# Patient Record
Sex: Female | Born: 1970 | Race: White | Hispanic: No | Marital: Married | Smoking: Never smoker
Health system: Southern US, Community
[De-identification: ages and names within clinical notes are randomized; demographics above are authoritative.]

## PROBLEM LIST (undated history)

## (undated) DIAGNOSIS — R002 Palpitations: Secondary | ICD-10-CM

## (undated) DIAGNOSIS — E282 Polycystic ovarian syndrome: Secondary | ICD-10-CM

## (undated) DIAGNOSIS — Q969 Turner's syndrome, unspecified: Secondary | ICD-10-CM

## (undated) DIAGNOSIS — F411 Generalized anxiety disorder: Secondary | ICD-10-CM

## (undated) DIAGNOSIS — J309 Allergic rhinitis, unspecified: Secondary | ICD-10-CM

## (undated) DIAGNOSIS — I10 Essential (primary) hypertension: Secondary | ICD-10-CM

## (undated) DIAGNOSIS — F329 Major depressive disorder, single episode, unspecified: Secondary | ICD-10-CM

## (undated) DIAGNOSIS — E119 Type 2 diabetes mellitus without complications: Secondary | ICD-10-CM

## (undated) DIAGNOSIS — E785 Hyperlipidemia, unspecified: Secondary | ICD-10-CM

## (undated) DIAGNOSIS — M1990 Unspecified osteoarthritis, unspecified site: Secondary | ICD-10-CM

## (undated) DIAGNOSIS — J019 Acute sinusitis, unspecified: Secondary | ICD-10-CM

## (undated) HISTORY — DX: Turner's syndrome, unspecified: Q96.9

## (undated) HISTORY — DX: Major depressive disorder, single episode, unspecified: F32.9

## (undated) HISTORY — DX: Acute sinusitis, unspecified: J01.90

## (undated) HISTORY — DX: Polycystic ovarian syndrome: E28.2

## (undated) HISTORY — DX: Hyperlipidemia, unspecified: E78.5

## (undated) HISTORY — DX: Type 2 diabetes mellitus without complications: E11.9

## (undated) HISTORY — DX: Unspecified osteoarthritis, unspecified site: M19.90

## (undated) HISTORY — DX: Allergic rhinitis, unspecified: J30.9

## (undated) HISTORY — DX: Essential (primary) hypertension: I10

## (undated) HISTORY — PX: COLONOSCOPY: SHX174

## (undated) HISTORY — DX: Generalized anxiety disorder: F41.1

## (undated) HISTORY — DX: Palpitations: R00.2

---

## 1999-02-19 ENCOUNTER — Other Ambulatory Visit: Admission: RE | Admit: 1999-02-19 | Discharge: 1999-02-19 | Payer: Self-pay | Admitting: Gynecology

## 2003-04-25 ENCOUNTER — Other Ambulatory Visit: Admission: RE | Admit: 2003-04-25 | Discharge: 2003-04-25 | Payer: Self-pay | Admitting: Gynecology

## 2003-05-01 ENCOUNTER — Encounter: Admission: RE | Admit: 2003-05-01 | Discharge: 2003-05-01 | Payer: Self-pay | Admitting: Internal Medicine

## 2003-05-10 ENCOUNTER — Ambulatory Visit (HOSPITAL_COMMUNITY): Admission: RE | Admit: 2003-05-10 | Discharge: 2003-05-10 | Payer: Self-pay | Admitting: Internal Medicine

## 2004-08-11 ENCOUNTER — Other Ambulatory Visit: Admission: RE | Admit: 2004-08-11 | Discharge: 2004-08-11 | Payer: Self-pay | Admitting: Gynecology

## 2004-08-26 ENCOUNTER — Ambulatory Visit: Payer: Self-pay | Admitting: Internal Medicine

## 2005-08-05 ENCOUNTER — Other Ambulatory Visit: Admission: RE | Admit: 2005-08-05 | Discharge: 2005-08-05 | Payer: Self-pay | Admitting: Gynecology

## 2005-08-19 ENCOUNTER — Ambulatory Visit: Payer: Self-pay | Admitting: Internal Medicine

## 2005-09-10 ENCOUNTER — Ambulatory Visit: Payer: Self-pay | Admitting: Internal Medicine

## 2005-09-14 ENCOUNTER — Ambulatory Visit: Payer: Self-pay | Admitting: Internal Medicine

## 2005-12-15 ENCOUNTER — Ambulatory Visit: Payer: Self-pay | Admitting: Internal Medicine

## 2005-12-15 LAB — CONVERTED CEMR LAB
ALT: 27 units/L (ref 0–40)
AST: 27 units/L (ref 0–37)
Albumin: 4.4 g/dL (ref 3.5–5.2)
Basophils Absolute: 0 10*3/uL (ref 0.0–0.1)
Basophils Relative: 0.2 % (ref 0.0–1.0)
CO2: 25 meq/L (ref 19–32)
Chol/HDL Ratio, serum: 4.5
Cholesterol: 174 mg/dL (ref 0–200)
Creatinine, Ser: 0.7 mg/dL (ref 0.4–1.2)
Crystals: NEGATIVE
GFR calc non Af Amer: 101 mL/min
Glomerular Filtration Rate, Af Am: 122 mL/min/{1.73_m2}
Glucose, Bld: 111 mg/dL — ABNORMAL HIGH (ref 70–99)
HCT: 42.1 % (ref 36.0–46.0)
HDL: 38.6 mg/dL — ABNORMAL LOW (ref >39.0)
LDL Cholesterol: 111 mg/dL — ABNORMAL HIGH (ref 0–99)
MCHC: 33.8 g/dL (ref 30.0–36.0)
Monocytes Absolute: 0.6 10*3/uL (ref 0.2–0.7)
Mucus, UA: NEGATIVE
Neutro Abs: 3.3 10*3/uL (ref 1.4–7.7)
RBC: 4.58 M/uL (ref 3.87–5.11)
RDW: 11.5 % (ref 11.5–14.6)
Sodium: 141 meq/L (ref 135–145)
Specific Gravity, Urine: 1.015 (ref 1.000–1.03)
Total Bilirubin: 0.5 mg/dL (ref 0.3–1.2)
Total Protein: 7.8 g/dL (ref 6.0–8.3)
WBC: 5.9 10*3/uL (ref 4.5–10.5)
pH: 7 (ref 5.0–8.0)

## 2005-12-23 ENCOUNTER — Encounter: Payer: Self-pay | Admitting: Cardiology

## 2005-12-23 ENCOUNTER — Ambulatory Visit: Payer: Self-pay

## 2007-01-13 ENCOUNTER — Telehealth: Payer: Self-pay | Admitting: Internal Medicine

## 2007-01-23 ENCOUNTER — Ambulatory Visit: Payer: Self-pay | Admitting: Internal Medicine

## 2007-01-23 LAB — CONVERTED CEMR LAB
AST: 21 units/L (ref 0–37)
Albumin: 3.9 g/dL (ref 3.5–5.2)
Alkaline Phosphatase: 71 units/L (ref 39–117)
BUN: 6 mg/dL (ref 6–23)
Basophils Absolute: 0 10*3/uL (ref 0.0–0.1)
Bilirubin Urine: NEGATIVE
Cholesterol: 170 mg/dL (ref 0–200)
Creatinine, Ser: 0.8 mg/dL (ref 0.4–1.2)
Eosinophils Absolute: 0.3 10*3/uL (ref 0.0–0.6)
HCT: 38.9 % (ref 36.0–46.0)
HDL: 33.4 mg/dL — ABNORMAL LOW (ref >39.0)
Hemoglobin, Urine: NEGATIVE
Hemoglobin: 13.9 g/dL (ref 12.0–15.0)
Leukocytes, UA: NEGATIVE
Lymphocytes Relative: 28.1 % (ref 12.0–46.0)
MCHC: 35.6 g/dL (ref 30.0–36.0)
MCV: 91.3 fL (ref 78.0–100.0)
Monocytes Absolute: 0.6 10*3/uL (ref 0.2–0.7)
Monocytes Relative: 10.4 % (ref 3.0–11.0)
Neutro Abs: 3.2 10*3/uL (ref 1.4–7.7)
Neutrophils Relative %: 56.1 % (ref 43.0–77.0)
RDW: 11.9 % (ref 11.5–14.6)
Total Bilirubin: 0.7 mg/dL (ref 0.3–1.2)
Total Protein: 6.8 g/dL (ref 6.0–8.3)
Urobilinogen, UA: 0.2 (ref 0.0–1.0)
VLDL: 47 mg/dL — ABNORMAL HIGH (ref 0–40)
pH: 6.5 (ref 5.0–8.0)

## 2007-01-27 ENCOUNTER — Ambulatory Visit: Payer: Self-pay | Admitting: Internal Medicine

## 2007-01-27 DIAGNOSIS — J309 Allergic rhinitis, unspecified: Secondary | ICD-10-CM | POA: Insufficient documentation

## 2007-01-27 DIAGNOSIS — F3289 Other specified depressive episodes: Secondary | ICD-10-CM

## 2007-01-27 DIAGNOSIS — F411 Generalized anxiety disorder: Secondary | ICD-10-CM

## 2007-01-27 DIAGNOSIS — I10 Essential (primary) hypertension: Secondary | ICD-10-CM

## 2007-01-27 DIAGNOSIS — F329 Major depressive disorder, single episode, unspecified: Secondary | ICD-10-CM

## 2007-01-27 DIAGNOSIS — E785 Hyperlipidemia, unspecified: Secondary | ICD-10-CM | POA: Insufficient documentation

## 2007-01-27 DIAGNOSIS — R7302 Impaired glucose tolerance (oral): Secondary | ICD-10-CM

## 2007-01-27 DIAGNOSIS — E119 Type 2 diabetes mellitus without complications: Secondary | ICD-10-CM

## 2007-01-27 HISTORY — DX: Other specified depressive episodes: F32.89

## 2007-01-27 HISTORY — DX: Essential (primary) hypertension: I10

## 2007-01-27 HISTORY — DX: Allergic rhinitis, unspecified: J30.9

## 2007-01-27 HISTORY — DX: Major depressive disorder, single episode, unspecified: F32.9

## 2007-01-27 HISTORY — DX: Hyperlipidemia, unspecified: E78.5

## 2007-01-27 HISTORY — DX: Generalized anxiety disorder: F41.1

## 2007-01-27 HISTORY — DX: Type 2 diabetes mellitus without complications: E11.9

## 2007-01-27 LAB — CONVERTED CEMR LAB: Hgb A1c MFr Bld: 5.7 % (ref 4.6–6.0)

## 2007-01-30 ENCOUNTER — Other Ambulatory Visit: Admission: RE | Admit: 2007-01-30 | Discharge: 2007-01-30 | Payer: Self-pay | Admitting: Gynecology

## 2007-08-07 LAB — CONVERTED CEMR LAB: Pap Smear: NORMAL

## 2008-01-30 ENCOUNTER — Ambulatory Visit: Payer: Self-pay | Admitting: Internal Medicine

## 2008-01-30 DIAGNOSIS — Q969 Turner's syndrome, unspecified: Secondary | ICD-10-CM

## 2008-01-30 DIAGNOSIS — J019 Acute sinusitis, unspecified: Secondary | ICD-10-CM

## 2008-01-30 HISTORY — DX: Acute sinusitis, unspecified: J01.90

## 2008-01-30 HISTORY — DX: Turner's syndrome, unspecified: Q96.9

## 2008-02-06 ENCOUNTER — Ambulatory Visit: Payer: Self-pay

## 2008-02-06 ENCOUNTER — Encounter: Payer: Self-pay | Admitting: Internal Medicine

## 2008-02-22 ENCOUNTER — Ambulatory Visit: Payer: Self-pay | Admitting: Internal Medicine

## 2008-02-23 LAB — CONVERTED CEMR LAB
ALT: 21 units/L (ref 0–35)
Alkaline Phosphatase: 55 units/L (ref 39–117)
Basophils Absolute: 0 10*3/uL (ref 0.0–0.1)
Bilirubin Urine: NEGATIVE
Bilirubin, Direct: 0.1 mg/dL (ref 0.0–0.3)
Calcium: 9.5 mg/dL (ref 8.4–10.5)
Crystals: NEGATIVE
Eosinophils Absolute: 0.2 10*3/uL (ref 0.0–0.7)
GFR calc Af Amer: 121 mL/min
GFR calc non Af Amer: 100 mL/min
Glucose, Bld: 103 mg/dL — ABNORMAL HIGH (ref 70–99)
HCT: 41 % (ref 36.0–46.0)
HDL: 44.6 mg/dL (ref >39.0)
Hemoglobin, Urine: NEGATIVE
Hemoglobin: 14.4 g/dL (ref 12.0–15.0)
Ketones, ur: NEGATIVE mg/dL
MCHC: 35.1 g/dL (ref 30.0–36.0)
MCV: 91.9 fL (ref 78.0–100.0)
Microalb, Ur: 0.7 mg/dL (ref 0.0–1.9)
Monocytes Absolute: 0.6 10*3/uL (ref 0.1–1.0)
Monocytes Relative: 12.8 % — ABNORMAL HIGH (ref 3.0–12.0)
Neutro Abs: 2.3 10*3/uL (ref 1.4–7.7)
Platelets: 247 10*3/uL (ref 150–400)
RDW: 11.8 % (ref 11.5–14.6)
Sodium: 140 meq/L (ref 135–145)
TSH: 1.64 microintl units/mL (ref 0.35–5.50)
Total Bilirubin: 1.1 mg/dL (ref 0.3–1.2)
Total Protein, Urine: NEGATIVE mg/dL
Triglycerides: 66 mg/dL (ref 0–149)
Urine Glucose: NEGATIVE mg/dL
VLDL: 13 mg/dL (ref 0–40)
pH: 6.5 (ref 5.0–8.0)

## 2008-02-28 ENCOUNTER — Ambulatory Visit: Payer: Self-pay | Admitting: Internal Medicine

## 2009-04-30 ENCOUNTER — Ambulatory Visit: Payer: Self-pay | Admitting: Internal Medicine

## 2009-04-30 DIAGNOSIS — M549 Dorsalgia, unspecified: Secondary | ICD-10-CM | POA: Insufficient documentation

## 2009-05-09 ENCOUNTER — Encounter: Payer: Self-pay | Admitting: Internal Medicine

## 2009-05-09 LAB — CONVERTED CEMR LAB
Basophils Absolute: 0 10*3/uL (ref 0.0–0.1)
CO2: 29 meq/L (ref 19–32)
Calcium: 9.5 mg/dL (ref 8.4–10.5)
Chloride: 106 meq/L (ref 96–112)
Cholesterol: 162 mg/dL (ref 0–200)
Creatinine, Ser: 0.6 mg/dL (ref 0.4–1.2)
Creatinine,U: 77.2 mg/dL
Eosinophils Absolute: 0.2 10*3/uL (ref 0.0–0.7)
Glucose, Bld: 90 mg/dL (ref 70–99)
HCT: 40.8 % (ref 36.0–46.0)
Hemoglobin: 14.1 g/dL (ref 12.0–15.0)
Ketones, ur: NEGATIVE mg/dL
LDL Cholesterol: 101 mg/dL — ABNORMAL HIGH (ref 0–99)
Lymphs Abs: 2.1 10*3/uL (ref 0.7–4.0)
MCHC: 34.5 g/dL (ref 30.0–36.0)
MCV: 92.4 fL (ref 78.0–100.0)
Monocytes Absolute: 0.6 10*3/uL (ref 0.1–1.0)
Neutro Abs: 1.9 10*3/uL (ref 1.4–7.7)
Nitrite: NEGATIVE
Platelets: 266 10*3/uL (ref 150.0–400.0)
Potassium: 4.4 meq/L (ref 3.5–5.1)
RDW: 12.6 % (ref 11.5–14.6)
Specific Gravity, Urine: 1.01 (ref 1.000–1.030)
TSH: 1.72 microintl units/mL (ref 0.35–5.50)
Total Bilirubin: 0.8 mg/dL (ref 0.3–1.2)
Total Protein: 7.5 g/dL (ref 6.0–8.3)
Triglycerides: 110 mg/dL (ref 0.0–149.0)
VLDL: 22 mg/dL (ref 0.0–40.0)
pH: 7 (ref 5.0–8.0)

## 2009-05-10 LAB — CONVERTED CEMR LAB: Vit D, 25-Hydroxy: 15 ng/mL — ABNORMAL LOW (ref 30–89)

## 2010-02-03 NOTE — Assessment & Plan Note (Signed)
Summary: FU  STC   Vital Signs:  Patient profile:   40 year old female Height:      60 inches Weight:      116.25 pounds BMI:     22.79 O2 Sat:      95 % on Room air Temp:     97.8 degrees F oral Pulse rate:   91 / minute BP sitting:   116 / 74  (left arm) Cuff size:   regular  Vitals Entered ByZella Ball Ewing (April 30, 2009 2:59 PM)  O2 Flow:  Room air CC: Followup/RE   CC:  Followup/RE.  History of Present Illness: stopped the hctz due to dizziness and excercise for BP control;  Pt denies CP, sob, doe, wheezing, orthopnea, pnd, worsening LE edema, palps, dizziness or syncope   Pt denies new neuro symptoms such as headache, facial or extremity weakness   here with one wk back pain across the lower back, no radiation, or LE pain, weakness, or numbness;  started after hula hoop with the Wii; overall improved, mild only now. gait normal, no falls or other neuro symptoms.   Preventive Screening-Counseling & Management      Drug Use:  no.    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 Take 1 Tablet By Mouth Once A Day 2)  Hydrochlorothiazide 25 Mg Tabs (Hydrochlorothiazide) .... Take 1 Tablet By Mouth Once A Day  Current Medications (verified): 1)  Citalopram Hydrobromide 40 Mg Tabs (Citalopram Hydrobromide) .... Take 1 Tablet By Mouth Once A Day  Allergies (verified): No Known Drug Allergies  Past History:  Past Medical History: Last updated: 02/28/2008 Hypertension Diabetes  mellitus, type II - diet Hyperlipidemia turner's syndrome Anxiety Depression PCO syndrome Allergic rhinitis palpitations  Past Surgical History: Last updated: 01/27/2007 Denies surgical history  Family History: Last updated: 02/28/2008 mother, grandparents with DM Family History Hypertension father with skin cancers, died with MI at 40 yo,  grandmother with lung cancer and stroke, ETOH abuse, osteoporosis uncle with ETOH dependence aunt with breast cancer aunt with MI in her 87's  Social History: Last updated: 04/30/2009 Never Smoked Alcohol use-yes Married no children work - collections /[employer] Drug use-no  Risk Factors: Smoking Status: never (01/27/2007)  Social History: Reviewed history from 02/28/2008 and no changes required. Never Smoked Alcohol use-yes Married no children work - collections IT consultant Drug use-no Drug Use:  no  Review of Systems  The patient denies anorexia, fever, vision loss, decreased hearing, hoarseness, chest pain, syncope, dyspnea on exertion, peripheral edema, prolonged cough, headaches, hemoptysis, abdominal pain, melena, hematochezia, severe indigestion/heartburn, hematuria, muscle weakness, suspicious skin lesions, transient blindness, difficulty walking, depression, unusual weight change, abnormal bleeding, enlarged lymph nodes, and angioedema.         all otherwise negative per pt -    Physical Exam  General:  alert and well-developed.   Head:  normocephalic and atraumatic.   Eyes:  vision grossly intact, pupils equal, and pupils round.   Ears:  R ear normal and  L ear normal.   Nose:  no external deformity and no nasal discharge.   Mouth:  no gingival abnormalities and pharynx pink and moist.   Neck:  supple and no masses.   Lungs:  normal respiratory effort and normal breath sounds.   Heart:  normal rate and regular rhythm.   Abdomen:  soft, non-tender, and normal bowel sounds.   Msk:  no joint  tenderness and no joint swelling.  , no spine tender or paravertebral tender or spasm Extremities:  no edema, no erythema  Neurologic:  cranial nerves II-XII intact, strength normal in all extremities, sensation intact to light touch, gait normal, and DTRs symmetrical and normal.   Skin:  color normal and no rashes.   Psych:  normally interactive and slightly anxious.     Impression & Recommendations:  Problem # 1:  Preventive Health Care (ICD-V70.0) Overall doing well, age appropriate education and counseling updated and referral for appropriate preventive services done unless declined, immunizations up to date or declined, diet counseling done if overweight, urged to quit smoking if smokes , most recent labs reviewed and current ordered if appropriate, ecg reviewed or declined (interpretation per ECG scanned in the EMR if done); information regarding Medicare Prevention requirements given if appropriate  Orders: TLB-BMP (Basic Metabolic Panel-BMET) (80048-METABOL) TLB-CBC Platelet - w/Differential (85025-CBCD) TLB-Hepatic/Liver Function Pnl (80076-HEPATIC) TLB-Lipid Panel (80061-LIPID) TLB-TSH (Thyroid Stimulating Hormone) (84443-TSH) TLB-Udip ONLY (81003-UDIP) T-Vitamin D (25-Hydroxy) ([address]-60454)  Problem # 2:  DIABETES MELLITUS, TYPE II (ICD-250.00)  stable overall by hx and exam, ok to continue meds/tx as is  Orders: TLB-A1C / Hgb A1C (Glycohemoglobin) (83036-A1C) TLB-Microalbumin/Creat Ratio, Urine (82043-MALB)  Labs Reviewed: Creat: 0.7 (02/22/2008)    Reviewed HgBA1c results: 5.6 (02/22/2008)  5.7 (01/27/2007)  Problem # 3:  HYPERTENSION (ICD-401.9)  The following medications were removed from the medication list:    Hydrochlorothiazide 25 Mg Tabs (Hydrochlorothiazide) .Marland Kitchen... Take 1 tablet by mouth once a day  BP today: 116/74 Prior BP: 116/80 (02/28/2008)  Labs Reviewed: K+: 3.9 (02/22/2008) Creat: : 0.7 (02/22/2008)   Chol: 169 (02/22/2008)   HDL: 44.6  (02/22/2008)   LDL: 111 (02/22/2008)   TG: 66 (02/22/2008) stable overall by hx and exam, ok to continue meds/tx as is   Problem # 4:  BACK PAIN (ICD-724.5) c/w strain most liekly - for tylenol as needed, f/u any worsening symptoms  Complete Medication List: 1)  Citalopram Hydrobromide 40 Mg Tabs (Citalopram hydrobromide) .... Take 1 tablet by mouth once a day  Patient Instructions: 1)  Please go to the Lab in the basement for your blood and/or urine tests today 2)  stop the HCTZ as you have done 3)  Continue all previous medications as before this visit 4)  Please schedule a follow-up appointment in 1 year or sooner if needed Prescriptions: CITALOPRAM HYDROBROMIDE 40 MG TABS (CITALOPRAM HYDROBROMIDE) Take 1 tablet by mouth once a day  #90 x 3   Entered and Authorized by:   Corwin Levins MD   Signed by:   Corwin Levins MD on 04/30/2009   Method used:   Electronically to        [facility] (retail)       [address]       [place], [place]  [address]       Ph: [phone]       Fax: [phone]   RxID:   [id number]

## 2010-10-30 ENCOUNTER — Encounter: Payer: Self-pay | Admitting: Endocrinology

## 2010-10-30 ENCOUNTER — Other Ambulatory Visit (INDEPENDENT_AMBULATORY_CARE_PROVIDER_SITE_OTHER): Payer: No Typology Code available for payment source

## 2010-10-30 ENCOUNTER — Ambulatory Visit (INDEPENDENT_AMBULATORY_CARE_PROVIDER_SITE_OTHER): Payer: No Typology Code available for payment source | Admitting: Endocrinology

## 2010-10-30 DIAGNOSIS — Q969 Turner's syndrome, unspecified: Secondary | ICD-10-CM

## 2010-10-30 DIAGNOSIS — E785 Hyperlipidemia, unspecified: Secondary | ICD-10-CM

## 2010-10-30 DIAGNOSIS — I10 Essential (primary) hypertension: Secondary | ICD-10-CM

## 2010-10-30 DIAGNOSIS — E119 Type 2 diabetes mellitus without complications: Secondary | ICD-10-CM

## 2010-10-30 DIAGNOSIS — Z79899 Other long term (current) drug therapy: Secondary | ICD-10-CM

## 2010-10-30 LAB — CBC WITH DIFFERENTIAL/PLATELET
Eosinophils Absolute: 0.2 10*3/uL (ref 0.0–0.7)
Lymphocytes Relative: 34.4 % (ref 12.0–46.0)
Lymphs Abs: 2.2 10*3/uL (ref 0.7–4.0)
MCHC: 34.6 g/dL (ref 30.0–36.0)
Monocytes Absolute: 0.6 10*3/uL (ref 0.1–1.0)
Monocytes Relative: 10.2 % (ref 3.0–12.0)
Platelets: 282 10*3/uL (ref 150.0–400.0)
RDW: 12.6 % (ref 11.5–14.6)

## 2010-10-30 LAB — BASIC METABOLIC PANEL
CO2: 26 mEq/L (ref 19–32)
GFR: 105.11 mL/min (ref >60.00)
Glucose, Bld: 98 mg/dL (ref 70–99)
Potassium: 4 mEq/L (ref 3.5–5.1)
Sodium: 142 mEq/L (ref 135–145)

## 2010-10-30 LAB — URINALYSIS, ROUTINE W REFLEX MICROSCOPIC
Leukocytes, UA: NEGATIVE
Nitrite: NEGATIVE
Specific Gravity, Urine: 1.015 (ref 1.000–1.030)
Urobilinogen, UA: 0.2 (ref 0.0–1.0)
pH: 7.5 (ref 5.0–8.0)

## 2010-10-30 LAB — HEPATIC FUNCTION PANEL
Alkaline Phosphatase: 62 U/L (ref 39–117)
Bilirubin, Direct: 0.1 mg/dL (ref 0.0–0.3)
Total Protein: 8.3 g/dL (ref 6.0–8.3)

## 2010-10-30 LAB — LIPID PANEL
Cholesterol: 178 mg/dL (ref 0–200)
LDL Cholesterol: 117 mg/dL — ABNORMAL HIGH (ref 0–99)
Total CHOL/HDL Ratio: 4
Triglycerides: 83 mg/dL (ref 0.0–149.0)
VLDL: 16.6 mg/dL (ref 0.0–40.0)

## 2010-10-30 LAB — HEMOGLOBIN A1C: Hgb A1c MFr Bld: 5.7 % (ref 4.6–6.5)

## 2010-10-30 MED ORDER — CEFUROXIME AXETIL 250 MG PO TABS: 250.0000 mg | ORAL_TABLET | Freq: Two times a day (BID) | ORAL | Status: AC

## 2010-10-30 MED ORDER — PROMETHAZINE-CODEINE 6.25-10 MG/5ML PO SYRP: 5.0000 mL | ORAL_SOLUTION | ORAL | Status: AC | PRN

## 2010-10-30 NOTE — Patient Instructions (Addendum)
i have sent a prescription to your pharmacy, for an antibiotic.  Loratadine-d (non-prescription) will help your congestion. I hope you feel better soon.  If you don't feel better by next week, please call dr Jonny Ruiz.   blood tests are being requested for you today.  please call [phone] to hear your test results.  You will be prompted to enter the 9-digit "MRN" number that appears at the top left of this page, followed by #.  Then you will hear the message. Please make a regular physical appointment with dr Jonny Ruiz.   Here is a prescription for cough syrup.  (update: i left message on phone-tree:  rx as we discussed)

## 2010-10-30 NOTE — Progress Notes (Signed)
  Subjective:    Patient ID: Lori Harmon, female    DOB: 08-09-1970, 40 y.o.   MRN: [id number]  HPI 1 week of slight dry cough in the chest, and assoc sore throat. Past Medical History  Diagnosis Date  . 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 SINUSITIS- ACUTE-NOS 01/30/2008  . HYPERTENSION 01/27/2007  . HYPERLIPIDEMIA 01/27/2007  . DIABETES MELLITUS, TYPE II 01/27/2007  . DEPRESSION 01/27/2007  . ANXIETY 01/27/2007  . ALLERGIC RHINITIS 01/27/2007  . Palpitations   . PCOS (polycystic ovarian syndrome)     No past surgical history on file.  History   Social History  . Marital Status: Married    Spouse Name: N/A    Number of Children: 0  . Years of Education: N/A   Occupational History  . Collections [employer]   Social History Main Topics  . Smoking status: Never Smoker   . Smokeless tobacco: Not on file  . Alcohol Use: Yes  . Drug Use: No  . Sexually Active:    Other Topics Concern  . Not on file   Social History Narrative  . No narrative on file    No current outpatient prescriptions on file prior to visit.    No Known Allergies  Family History  Problem Relation Age of Onset  . Diabetes Mother   . Cancer Father     Skin Cancers  . Heart attack Father   . Diabetes Other     Grandparents  . Hypertension Other     FH of  . Cancer Other     Aunt-Breast Cancer  . Heart attack Other     Aunt-MI in her 50's  . Alcohol abuse Other     Uncle  . Alcohol abuse Other     Grandmother  . Cancer Other     Grandmother-Lung Cancer  . Stroke Other     BP 136/88  Pulse 83  Temp(Src) 98.3 F (36.8 C) (Oral)  Ht 5' (1.524 m)  Wt 118 lb 3.2 oz (53.615 kg)  BMI 23.08 kg/m2  SpO2 98%  LMP 10/25/2010  Review of Systems She has nasal congestion.  Denies fever.      Objective:   Physical Exam VITAL SIGNS:  See vs page GENERAL: no distress head: no deformity eyes: no periorbital swelling, no proptosis external nose and ears are normal mouth: no lesion  seen Both tm's are slightly red and congested. NECK: There is no palpable thyroid enlargement.  No thyroid nodule is palpable.  No palpable lymphadenopathy at the anterior neck. LUNGS:  Clear to auscultation.   Lab Results  Component Value Date   WBC 6.3 10/30/2010   HGB 14.4 10/30/2010   HCT 41.7 10/30/2010   PLT 282.0 10/30/2010   GLUCOSE 98 10/30/2010   CHOL 178 10/30/2010   TRIG 83.0 10/30/2010   HDL 44.40 10/30/2010   LDLDIRECT 109.5 01/23/2007   LDLCALC 117* 10/30/2010   ALT 19 10/30/2010   AST 17 10/30/2010   NA 142 10/30/2010   K 4.0 10/30/2010   CL 107 10/30/2010   CREATININE 0.7 10/30/2010   BUN 11 10/30/2010   CO2 26 10/30/2010   TSH 2.12 10/30/2010   HGBA1C 5.7 10/30/2010   MICROALBUR 0.3 10/30/2010      Assessment & Plan:  Acute bronchitis, new Dyslipidemia, needs increased rx Dm, well-controlled

## 2010-12-07 ENCOUNTER — Telehealth: Payer: Self-pay

## 2010-12-07 ENCOUNTER — Encounter: Payer: Self-pay | Admitting: Internal Medicine

## 2010-12-07 DIAGNOSIS — Z0001 Encounter for general adult medical examination with abnormal findings: Secondary | ICD-10-CM | POA: Insufficient documentation

## 2010-12-07 DIAGNOSIS — Z0000 Encounter for general adult medical examination without abnormal findings: Secondary | ICD-10-CM

## 2010-12-07 NOTE — Telephone Encounter (Signed)
Put order in for physical labs. 

## 2010-12-08 ENCOUNTER — Encounter: Payer: Self-pay | Admitting: Internal Medicine

## 2010-12-08 ENCOUNTER — Ambulatory Visit (INDEPENDENT_AMBULATORY_CARE_PROVIDER_SITE_OTHER): Payer: No Typology Code available for payment source | Admitting: Internal Medicine

## 2010-12-08 VITALS — BP 108/70 | HR 87 | Temp 98.4°F | Ht 60.0 in | Wt 121.5 lb

## 2010-12-08 DIAGNOSIS — E119 Type 2 diabetes mellitus without complications: Secondary | ICD-10-CM

## 2010-12-08 DIAGNOSIS — J209 Acute bronchitis, unspecified: Secondary | ICD-10-CM | POA: Insufficient documentation

## 2010-12-08 DIAGNOSIS — Z0000 Encounter for general adult medical examination without abnormal findings: Secondary | ICD-10-CM

## 2010-12-08 MED ORDER — AZITHROMYCIN 250 MG PO TABS: ORAL_TABLET | ORAL | Status: AC

## 2010-12-08 MED ORDER — CITALOPRAM HYDROBROMIDE 40 MG PO TABS: 40.0000 mg | ORAL_TABLET | Freq: Every day | ORAL | Status: DC

## 2010-12-08 MED ORDER — HYDROCODONE-HOMATROPINE 5-1.5 MG/5ML PO SYRP: 5.0000 mL | ORAL_SOLUTION | Freq: Four times a day (QID) | ORAL | Status: AC | PRN

## 2010-12-08 NOTE — Patient Instructions (Signed)
Take all new medications as prescribed Continue all other medications as before Please return in 1 year for your yearly visit, or sooner if needed, with Lab testing done 3-5 days before`  

## 2010-12-08 NOTE — Assessment & Plan Note (Signed)

## 2010-12-08 NOTE — Assessment & Plan Note (Signed)
Mild to mod, for antibx course,  to f/u any worsening symptoms or concerns 

## 2010-12-08 NOTE — Assessment & Plan Note (Signed)
stable overall by hx and exam, most recent data reviewed with pt, and pt to continue medical treatment as before  Lab Results  Component Value Date   HGBA1C 5.7 10/30/2010

## 2010-12-08 NOTE — Progress Notes (Signed)
Subjective:    Patient ID: Lori Harmon, female    DOB: 1970/01/07, 40 y.o.   MRN: [id number]  HPI  Here for wellness and f/u;  Overall doing ok;  Pt denies CP, worsening SOB, DOE, wheezing, orthopnea, PND, worsening LE edema, palpitations, dizziness or syncope.  Pt denies neurological change such as new Headache, facial or extremity weakness.  Pt denies polydipsia, polyuria, or low sugar symptoms. Pt states overall good compliance with treatment and medications, good tolerability, and trying to follow lower cholesterol diet.  Pt denies worsening depressive symptoms, suicidal ideation or panic. No fever, wt loss, night sweats, loss of appetite, or other constitutional symptoms.  Pt states good ability with ADL's, low fall risk, home safety reviewed and adequate, no significant changes in hearing or vision, and occasionally active with exercise.  Here with acute onset mild to mod 2-3 days ST, HA, general weakness and malaise, with prod cough greenish sputum, but Pt denies chest pain, increased sob or doe, wheezing, orthopnea, PND, increased LE swelling, palpitations, dizziness or syncope. Past Medical History  Diagnosis Date  . 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 SINUSITIS- ACUTE-NOS 01/30/2008  . HYPERTENSION 01/27/2007  . HYPERLIPIDEMIA 01/27/2007  . DIABETES MELLITUS, TYPE II 01/27/2007  . DEPRESSION 01/27/2007  . ANXIETY 01/27/2007  . ALLERGIC RHINITIS 01/27/2007  . Palpitations   . PCOS (polycystic ovarian syndrome)    No past surgical history on file.  reports that she has never smoked. She does not have any smokeless tobacco history on file. She reports that she drinks alcohol. She reports that she does not use illicit drugs. family history includes Alcohol abuse in her others; Cancer in her father and others; Diabetes in her mother and other; Heart attack in her father and other; Hypertension in her other; and Stroke in her other. No Known Allergies No current outpatient prescriptions on file  prior to visit.   Review of Systems Review of Systems  Constitutional: Negative for diaphoresis, activity change, appetite change and unexpected weight change.  HENT: Negative for hearing loss, ear pain, facial swelling, mouth sores and neck stiffness.   Eyes: Negative for pain, redness and visual disturbance.  Respiratory: Negative for shortness of breath and wheezing.   Cardiovascular: Negative for chest pain and palpitations.  Gastrointestinal: Negative for diarrhea, blood in stool, abdominal distention and rectal pain.  Genitourinary: Negative for hematuria, flank pain and decreased urine volume.  Musculoskeletal: Negative for myalgias and joint swelling.  Skin: Negative for color change and wound.  Neurological: Negative for syncope and numbness.  Hematological: Negative for adenopathy.  Psychiatric/Behavioral: Negative for hallucinations, self-injury, decreased concentration and agitation.      Objective:   Physical Exam  BP 108/70  Pulse 87  Temp(Src) 98.4 F (36.9 C) (Oral)  Ht 5' (1.524 m)  Wt 121 lb 8 oz (55.112 kg)  BMI 23.73 kg/m2  SpO2 97%  LMP 11/05/2010 Physical Exam  VS noted, mild ill Constitutional: Pt is oriented to person, place, and time. Appears well-developed and well-nourished.  HENT:  Head: Normocephalic and atraumatic.  Right Ear: External ear normal.  Left Ear: External ear normal.  Nose: Nose normal.  Mouth/Throat: Oropharynx is clear and moist.  Bilat tm's mild erythema.  Sinus nontender.  Pharynx mild erythema Eyes: Conjunctivae and EOM are normal. Pupils are equal, round, and reactive to light.  Neck: Normal range of motion. Neck supple. No JVD present. No tracheal deviation present.  Cardiovascular: Normal rate, regular rhythm, normal heart sounds and intact distal pulses.  Pulmonary/Chest: Effort normal and breath sounds normal.  Abdominal: Soft. Bowel sounds are normal. There is no tenderness.  Musculoskeletal: Normal range of motion.  Exhibits no edema.  Lymphadenopathy:  Has no cervical adenopathy.  Neurological: Pt is alert and oriented to person, place, and time. Pt has normal reflexes. No cranial nerve deficit.  Skin: Skin is warm and dry. No rash noted.  Psychiatric:  Has  normal mood and affect. Behavior is normal.     Assessment & Plan:

## 2011-01-11 ENCOUNTER — Encounter: Payer: No Typology Code available for payment source | Admitting: Internal Medicine

## 2012-02-02 ENCOUNTER — Encounter: Payer: Self-pay | Admitting: Internal Medicine

## 2012-02-02 ENCOUNTER — Ambulatory Visit (INDEPENDENT_AMBULATORY_CARE_PROVIDER_SITE_OTHER): Payer: No Typology Code available for payment source | Admitting: Internal Medicine

## 2012-02-02 VITALS — BP 120/80 | HR 97 | Temp 99.2°F | Ht 60.0 in | Wt 116.2 lb

## 2012-02-02 DIAGNOSIS — E119 Type 2 diabetes mellitus without complications: Secondary | ICD-10-CM

## 2012-02-02 DIAGNOSIS — F411 Generalized anxiety disorder: Secondary | ICD-10-CM

## 2012-02-02 DIAGNOSIS — Z0000 Encounter for general adult medical examination without abnormal findings: Secondary | ICD-10-CM

## 2012-02-02 DIAGNOSIS — J019 Acute sinusitis, unspecified: Secondary | ICD-10-CM

## 2012-02-02 DIAGNOSIS — I10 Essential (primary) hypertension: Secondary | ICD-10-CM

## 2012-02-02 MED ORDER — LEVOFLOXACIN 250 MG PO TABS: 250.0000 mg | ORAL_TABLET | Freq: Every day | ORAL | Status: DC

## 2012-02-02 MED ORDER — CITALOPRAM HYDROBROMIDE 40 MG PO TABS: 40.0000 mg | ORAL_TABLET | Freq: Every day | ORAL | Status: DC

## 2012-02-02 NOTE — Patient Instructions (Addendum)
Please take all new medication as prescribed - the antibiotic Please continue all other medications as before, and refills have been done if requested -the citalopram Please continue your efforts at being more active, low cholesterol diet, and weight control. Thank you for enrolling in MyChart. Please follow the instructions below to securely access your online medical record. MyChart allows you to send messages to your doctor, view your test results, renew your prescriptions, schedule appointments, and more. To Log into My Chart online, please go by [employer] or [employer] to [employer]com, or download the MyChart App from the [employer] of [employer] .  Your Username is: [email or web address]  (pass  [id number]) Please send a practice Message on Mychart later today. Please return in 3 months, or sooner if needed, with Lab testing done 3-5 days before

## 2012-02-02 NOTE — Assessment & Plan Note (Signed)
Mild to mod, for antibx course,  to f/u any worsening symptoms or concerns 

## 2012-02-02 NOTE — Assessment & Plan Note (Signed)
stable overall by history and exam, recent data reviewed with pt, and pt to continue medical treatment as before,  to f/u any worsening symptoms or concerns Lab Results  Component Value Date   HGBA1C 5.7 10/30/2010

## 2012-02-02 NOTE — Assessment & Plan Note (Signed)
stable overall by history and exam, recent data reviewed with pt, and pt to continue medical treatment as before,  to f/u any worsening symptoms or concerns BP Readings from Last 3 Encounters:  02/02/12 120/80  12/08/10 108/70  10/30/10 136/88

## 2012-02-02 NOTE — Assessment & Plan Note (Signed)
.  stable overall by history and exam, recent data reviewed with pt, and pt to continue medical treatment as before,  to f/u any worsening symptoms or concerns Lab Results  Component Value Date   WBC 6.3 10/30/2010   HGB 14.4 10/30/2010   HCT 41.7 10/30/2010   PLT 282.0 10/30/2010   GLUCOSE 98 10/30/2010   CHOL 178 10/30/2010   TRIG 83.0 10/30/2010   HDL 44.40 10/30/2010   LDLDIRECT 109.5 01/23/2007   LDLCALC 117* 10/30/2010   ALT 19 10/30/2010   AST 17 10/30/2010   NA 142 10/30/2010   K 4.0 10/30/2010   CL 107 10/30/2010   CREATININE 0.7 10/30/2010   BUN 11 10/30/2010   CO2 26 10/30/2010   TSH 2.12 10/30/2010   HGBA1C 5.7 10/30/2010   MICROALBUR 0.3 10/30/2010

## 2012-02-02 NOTE — Progress Notes (Signed)
  Subjective:    Patient ID: Lori Harmon, female    DOB: December 04, 1970, 42 y.o.   MRN: [id number]  HPI   Here with 2-3 days acute onset fever, facial pain, pressure, headache, general weakness and malaise, and greenish d/c, with mild ST and cough, but pt denies chest pain, wheezing, increased sob or doe, orthopnea, PND, increased LE swelling, palpitations, dizziness or syncope.  Pt denies polydipsia, polyuria, or low sugar symptoms such as weakness or confusion improved with po intake.  Pt denies new neurological symptoms such as new headache, or facial or extremity weakness or numbness.   Pt states overall good compliance with meds, has been trying to follow lower cholesterol, diabetic diet, with wt overall stable.  Denies worsening depressive symptoms, suicidal ideation, or panic; has ongoing anxiety, not increased recently.  Past Medical History  Diagnosis Date  . 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 SINUSITIS- ACUTE-NOS 01/30/2008  . HYPERTENSION 01/27/2007  . HYPERLIPIDEMIA 01/27/2007  . DIABETES MELLITUS, TYPE II 01/27/2007  . DEPRESSION 01/27/2007  . ANXIETY 01/27/2007  . ALLERGIC RHINITIS 01/27/2007  . Palpitations   . PCOS (polycystic ovarian syndrome)    No past surgical history on file.  reports that she has never smoked. She does not have any smokeless tobacco history on file. She reports that she drinks alcohol. She reports that she does not use illicit drugs. family history includes Alcohol abuse in her others; Cancer in her father and others; Diabetes in her mother and other; Heart attack in her father and other; Hypertension in her other; and Stroke in her other. No Known Allergies Current Outpatient Prescriptions on File Prior to Visit  Medication Sig Dispense Refill  . citalopram (CELEXA) 40 MG tablet Take 1 tablet (40 mg total) by mouth daily.  90 tablet  3   Review of Systems  Constitutional: Negative for unexpected weight change, or unusual diaphoresis  HENT: Negative for  tinnitus.   Eyes: Negative for photophobia and visual disturbance.  Respiratory: Negative for choking and stridor.   Gastrointestinal: Negative for vomiting and blood in stool.  Genitourinary: Negative for hematuria and decreased urine volume.  Musculoskeletal: Negative for acute joint swelling Skin: Negative for color change and wound.  Neurological: Negative for tremors and numbness other than noted  Psychiatric/Behavioral: Negative for decreased concentration or  hyperactivity.       Objective:   Physical Exam BP 120/80  Pulse 97  Temp 99.2 F (37.3 C) (Oral)  Ht 5' (1.524 m)  Wt 116 lb 4 oz (52.731 kg)  BMI 22.70 kg/m2  SpO2 97% VS noted, mild ill Constitutional: Pt appears well-developed and well-nourished.  HENT: Head: NCAT.  Right Ear: External ear normal.  Left Ear: External ear normal. Bilat tm's with mild erythema.  Max sinus areas mild tender.  Pharynx with mild erythema, no exudate  Eyes: Conjunctivae and EOM are normal. Pupils are equal, round, and reactive to light.  Neck: Normal range of motion. Neck supple.  Cardiovascular: Normal rate and regular rhythm.   Pulmonary/Chest: Effort normal and breath sounds normal.  Neurological: Pt is alert. Not confused  Skin: Skin is warm. No erythema.  Psychiatric: Pt behavior is normal. Thought content normal. not depressed affect, mild nervous    Assessment & Plan:

## 2012-04-14 ENCOUNTER — Other Ambulatory Visit (INDEPENDENT_AMBULATORY_CARE_PROVIDER_SITE_OTHER): Payer: No Typology Code available for payment source

## 2012-04-14 ENCOUNTER — Ambulatory Visit (INDEPENDENT_AMBULATORY_CARE_PROVIDER_SITE_OTHER): Payer: No Typology Code available for payment source | Admitting: Internal Medicine

## 2012-04-14 ENCOUNTER — Encounter: Payer: Self-pay | Admitting: Internal Medicine

## 2012-04-14 VITALS — BP 108/80 | HR 100 | Temp 98.7°F | Ht 60.0 in | Wt 118.5 lb

## 2012-04-14 DIAGNOSIS — J019 Acute sinusitis, unspecified: Secondary | ICD-10-CM

## 2012-04-14 DIAGNOSIS — Z0000 Encounter for general adult medical examination without abnormal findings: Secondary | ICD-10-CM

## 2012-04-14 DIAGNOSIS — Q969 Turner's syndrome, unspecified: Secondary | ICD-10-CM

## 2012-04-14 DIAGNOSIS — E119 Type 2 diabetes mellitus without complications: Secondary | ICD-10-CM

## 2012-04-14 LAB — LIPID PANEL
Cholesterol: 146 mg/dL (ref 0–200)
HDL: 39.3 mg/dL (ref >39.00)
Triglycerides: 109 mg/dL (ref 0.0–149.0)

## 2012-04-14 LAB — URINALYSIS, ROUTINE W REFLEX MICROSCOPIC
Bilirubin Urine: NEGATIVE
Ketones, ur: NEGATIVE
Leukocytes, UA: NEGATIVE
Urobilinogen, UA: 0.2 (ref 0.0–1.0)

## 2012-04-14 LAB — BASIC METABOLIC PANEL
CO2: 28 mEq/L (ref 19–32)
Calcium: 8.6 mg/dL (ref 8.4–10.5)
GFR: 99.14 mL/min (ref >60.00)
Sodium: 137 mEq/L (ref 135–145)

## 2012-04-14 LAB — HEPATIC FUNCTION PANEL
AST: 18 U/L (ref 0–37)
Albumin: 4.1 g/dL (ref 3.5–5.2)
Total Protein: 7.3 g/dL (ref 6.0–8.3)

## 2012-04-14 LAB — HEMOGLOBIN A1C: Hgb A1c MFr Bld: 5.5 % (ref 4.6–6.5)

## 2012-04-14 LAB — TSH: TSH: 0.78 u[IU]/mL (ref 0.35–5.50)

## 2012-04-14 MED ORDER — CITALOPRAM HYDROBROMIDE 40 MG PO TABS: 40.0000 mg | ORAL_TABLET | Freq: Every day | ORAL | Status: DC

## 2012-04-14 MED ORDER — DOXYCYCLINE HYCLATE 100 MG PO TABS: 100.0000 mg | ORAL_TABLET | Freq: Every day | ORAL | Status: DC

## 2012-04-14 MED ORDER — AZITHROMYCIN 250 MG PO TABS: ORAL_TABLET | ORAL | Status: DC

## 2012-04-14 NOTE — Assessment & Plan Note (Signed)

## 2012-04-14 NOTE — Progress Notes (Signed)
Subjective:    Patient ID: Lori Harmon, female    DOB: Mar 19, 1970, 42 y.o.   MRN: [id number]  HPI   Here for wellness and f/u;  Overall doing ok;  Pt denies CP, worsening SOB, DOE, wheezing, orthopnea, PND, worsening LE edema, palpitations, dizziness or syncope.  Pt denies neurological change such as new headache, facial or extremity weakness.  Pt denies polydipsia, polyuria, or low sugar symptoms. Pt states overall good compliance with treatment and medications, good tolerability, and has been trying to follow lower cholesterol diet.  Pt denies worsening depressive symptoms, suicidal ideation or panic. No fever, night sweats, wt loss, loss of appetite, or other constitutional symptoms.  Pt states good ability with ADL's, has low fall risk, home safety reviewed and adequate, no other significant changes in hearing or vision, and only occasionally active with exercise.   Here with 2-3 days acute onset fever, facial pain, pressure, headache, general weakness and malaise, and greenish d/c, with mild ST and cough, Currently on doxy 100 qd for acne for the past wk.  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 Past Medical History  Diagnosis Date  . 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 SINUSITIS- ACUTE-NOS 01/30/2008  . HYPERTENSION 01/27/2007  . HYPERLIPIDEMIA 01/27/2007  . DIABETES MELLITUS, TYPE II 01/27/2007  . DEPRESSION 01/27/2007  . ANXIETY 01/27/2007  . ALLERGIC RHINITIS 01/27/2007  . Palpitations   . PCOS (polycystic ovarian syndrome)    No past surgical history on file.  reports that she has never smoked. She does not have any smokeless tobacco history on file. She reports that  drinks alcohol. She reports that she does not use illicit drugs. family history includes Alcohol abuse in her others; Cancer in her father and others; Diabetes in her mother and other; Heart attack in her father and other; Hypertension in her other; and Stroke in her other. No Known Allergies No current outpatient  prescriptions on file prior to visit.   No current facility-administered medications on file prior to visit.   Review of Systems Constitutional: Negative for diaphoresis, activity change, appetite change or unexpected weight change.  HENT: Negative for hearing loss, ear pain, facial swelling, mouth sores and neck stiffness.   Eyes: Negative for pain, redness and visual disturbance.  Respiratory: Negative for shortness of breath and wheezing.   Cardiovascular: Negative for chest pain and palpitations.  Gastrointestinal: Negative for diarrhea, blood in stool, abdominal distention or other pain Genitourinary: Negative for hematuria, flank pain or change in urine volume.  Musculoskeletal: Negative for myalgias and joint swelling.  Skin: Negative for color change and wound.  Neurological: Negative for syncope and numbness. other than noted Hematological: Negative for adenopathy.  Psychiatric/Behavioral: Negative for hallucinations, self-injury, decreased concentration and agitation.      Objective:   Physical Exam BP 108/80  Pulse 100  Temp(Src) 98.7 F (37.1 C) (Oral)  Ht 5' (1.524 m)  Wt 118 lb 8 oz (53.751 kg)  BMI 23.14 kg/m2  SpO2 96% VS noted,  Constitutional: Pt is oriented to person, place, and time. Appears well-developed and well-nourished.  Head: Normocephalic and atraumatic.  Right Ear: External ear normal.  Left Ear: External ear normal.  Nose: Nose normal.  Mouth/Throat: Oropharynx is clear and moist. Bilat tm's with mild erythema.  Max sinus areas mild tender.  Pharynx with mild erythema, no exudate  Eyes: Conjunctivae and EOM are normal. Pupils are equal, round, and reactive to light.  Neck: Normal range of motion. Neck supple. No JVD  present. No tracheal deviation present.  Cardiovascular: Normal rate, regular rhythm, normal heart sounds and intact distal pulses.   Pulmonary/Chest: Effort normal and breath sounds normal.  Abdominal: Soft. Bowel sounds are normal.  There is no tenderness. No HSM  Musculoskeletal: Normal range of motion. Exhibits no edema.  Lymphadenopathy:  Has no cervical adenopathy.  Neurological: Pt is alert and oriented to person, place, and time. Pt has normal reflexes. No cranial nerve deficit.  Skin: Skin is warm and dry. No rash noted.  Psychiatric:  Has  normal mood and affect. Behavior is normal.    Assessment & Plan:

## 2012-04-14 NOTE — Assessment & Plan Note (Signed)
stable overall by history and exam, , and pt to continue medical treatment as before,  to f/u any worsening symptoms or concerns  

## 2012-04-14 NOTE — Patient Instructions (Addendum)
Please remember to followup with your GYN for the yearly pap smear and/or mammogram (such as at [facility]) You will be contacted regarding the referral for: echocardiogram Please take all new medication as prescribed - the antibiotic You can hold on taking the doxycycline when taking the new antibiotic You can also take sudafed OTC at 30 mg up to 3-4 times per day (but not too close to bedtime) Please continue all other medications as before, and refills have been done if requested - the celexa Please continue your efforts at being more active, low cholesterol diet, and weight control. You are otherwise up to date with prevention measures today. Please go to the LAB in the Basement (turn left off the elevator) for the tests to be done today You will be contacted by phone if any changes need to be made immediately.  Otherwise, you will receive a letter about your results with an explanation, but please check with MyChart first. Thank you for enrolling in MyChart. Please follow the instructions below to securely access your online medical record. MyChart allows you to send messages to your doctor, view your test results, renew your prescriptions, schedule appointments, and more. Please return in 1 year for your yearly visit, or sooner if needed, with Lab testing done 3-5 days before  OK to cancel appointment for next month

## 2012-04-14 NOTE — Assessment & Plan Note (Signed)
For echo f/u 

## 2012-04-14 NOTE — Assessment & Plan Note (Signed)
Mild to mod, for antibx course,  to f/u any worsening symptoms or concerns 

## 2012-04-17 LAB — CBC WITH DIFFERENTIAL/PLATELET
Basophils Absolute: 0 10*3/uL (ref 0.0–0.1)
HCT: 37.7 % (ref 36.0–46.0)
Hemoglobin: 13 g/dL (ref 12.0–15.0)
Lymphs Abs: 1.4 10*3/uL (ref 0.7–4.0)
MCHC: 34.6 g/dL (ref 30.0–36.0)
Monocytes Relative: 18.2 % — ABNORMAL HIGH (ref 3.0–12.0)
Neutro Abs: 3 10*3/uL (ref 1.4–7.7)
RDW: 13.6 % (ref 11.5–14.6)

## 2012-04-28 ENCOUNTER — Ambulatory Visit (HOSPITAL_COMMUNITY): Payer: No Typology Code available for payment source | Attending: Cardiology | Admitting: Radiology

## 2012-04-28 DIAGNOSIS — E785 Hyperlipidemia, unspecified: Secondary | ICD-10-CM | POA: Insufficient documentation

## 2012-04-28 DIAGNOSIS — Q969 Turner's syndrome, unspecified: Secondary | ICD-10-CM | POA: Insufficient documentation

## 2012-04-28 DIAGNOSIS — I10 Essential (primary) hypertension: Secondary | ICD-10-CM | POA: Insufficient documentation

## 2012-04-28 DIAGNOSIS — E119 Type 2 diabetes mellitus without complications: Secondary | ICD-10-CM | POA: Insufficient documentation

## 2012-04-28 NOTE — Progress Notes (Signed)
Echocardiogram performed.  

## 2012-05-16 ENCOUNTER — Encounter: Payer: No Typology Code available for payment source | Admitting: Internal Medicine

## 2012-11-09 ENCOUNTER — Other Ambulatory Visit: Payer: Self-pay

## 2013-04-04 LAB — HM MAMMOGRAPHY

## 2013-10-19 ENCOUNTER — Encounter: Payer: Self-pay | Admitting: Internal Medicine

## 2013-10-19 ENCOUNTER — Ambulatory Visit (INDEPENDENT_AMBULATORY_CARE_PROVIDER_SITE_OTHER): Payer: PRIVATE HEALTH INSURANCE | Admitting: Internal Medicine

## 2013-10-19 ENCOUNTER — Other Ambulatory Visit (INDEPENDENT_AMBULATORY_CARE_PROVIDER_SITE_OTHER): Payer: PRIVATE HEALTH INSURANCE

## 2013-10-19 VITALS — BP 118/80 | HR 97 | Temp 98.0°F | Ht 60.0 in | Wt 124.0 lb

## 2013-10-19 DIAGNOSIS — R7302 Impaired glucose tolerance (oral): Secondary | ICD-10-CM

## 2013-10-19 DIAGNOSIS — Z0000 Encounter for general adult medical examination without abnormal findings: Secondary | ICD-10-CM

## 2013-10-19 LAB — URINALYSIS, ROUTINE W REFLEX MICROSCOPIC
Bilirubin Urine: NEGATIVE
Hgb urine dipstick: NEGATIVE
Ketones, ur: NEGATIVE
Nitrite: NEGATIVE
Specific Gravity, Urine: 1.015
Total Protein, Urine: NEGATIVE
Urine Glucose: NEGATIVE
Urobilinogen, UA: 0.2
pH: 6.5 (ref 5.0–8.0)

## 2013-10-19 LAB — CBC WITH DIFFERENTIAL/PLATELET
Basophils Absolute: 0.1 10*3/uL (ref 0.0–0.1)
Basophils Relative: 1.6 % (ref 0.0–3.0)
Eosinophils Absolute: 0.3 10*3/uL (ref 0.0–0.7)
Eosinophils Relative: 3.5 % (ref 0.0–5.0)
HCT: 44.5 % (ref 36.0–46.0)
Hemoglobin: 14.9 g/dL (ref 12.0–15.0)
Lymphocytes Relative: 22 % (ref 12.0–46.0)
Lymphs Abs: 1.9 10*3/uL (ref 0.7–4.0)
MCHC: 33.4 g/dL (ref 30.0–36.0)
MCV: 93 fl (ref 78.0–100.0)
Monocytes Absolute: 0.9 10*3/uL (ref 0.1–1.0)
Monocytes Relative: 10.2 % (ref 3.0–12.0)
Neutro Abs: 5.4 10*3/uL (ref 1.4–7.7)
Neutrophils Relative %: 62.7 % (ref 43.0–77.0)
Platelets: 296 10*3/uL (ref 150.0–400.0)
RBC: 4.79 Mil/uL (ref 3.87–5.11)
RDW: 12.8 % (ref 11.5–15.5)
WBC: 8.6 10*3/uL (ref 4.0–10.5)

## 2013-10-19 LAB — LIPID PANEL
Cholesterol: 177 mg/dL (ref 0–200)
HDL: 39.6 mg/dL
LDL Cholesterol: 115 mg/dL — ABNORMAL HIGH (ref 0–99)
NonHDL: 137.4
Total CHOL/HDL Ratio: 4
Triglycerides: 114 mg/dL (ref 0.0–149.0)
VLDL: 22.8 mg/dL (ref 0.0–40.0)

## 2013-10-19 LAB — BASIC METABOLIC PANEL WITH GFR
BUN: 9 mg/dL (ref 6–23)
CO2: 28 meq/L (ref 19–32)
Calcium: 9.9 mg/dL (ref 8.4–10.5)
Chloride: 104 meq/L (ref 96–112)
Creatinine, Ser: 0.7 mg/dL (ref 0.4–1.2)
GFR: 93.71 mL/min
Glucose, Bld: 140 mg/dL — ABNORMAL HIGH (ref 70–99)
Potassium: 3.9 meq/L (ref 3.5–5.1)
Sodium: 138 meq/L (ref 135–145)

## 2013-10-19 LAB — HEPATIC FUNCTION PANEL
ALT: 23 U/L (ref 0–35)
AST: 22 U/L (ref 0–37)
Albumin: 4.2 g/dL (ref 3.5–5.2)
Alkaline Phosphatase: 55 U/L (ref 39–117)
Bilirubin, Direct: 0.1 mg/dL (ref 0.0–0.3)
Total Bilirubin: 0.9 mg/dL (ref 0.2–1.2)
Total Protein: 8.5 g/dL — ABNORMAL HIGH (ref 6.0–8.3)

## 2013-10-19 LAB — HEMOGLOBIN A1C: HEMOGLOBIN A1C: 5.8 % (ref 4.6–6.5)

## 2013-10-19 LAB — TSH: TSH: 1.53 u[IU]/mL (ref 0.35–4.50)

## 2013-10-19 MED ORDER — CITALOPRAM HYDROBROMIDE 40 MG PO TABS: 40.0000 mg | ORAL_TABLET | Freq: Every day | ORAL | Status: DC

## 2013-10-19 NOTE — Assessment & Plan Note (Signed)

## 2013-10-19 NOTE — Assessment & Plan Note (Signed)
stable overall by history and exam, recent data reviewed with pt, and pt to continue medical treatment as before,  to f/u any worsening symptoms or concerns Lab Results  Component Value Date   HGBA1C 5.5 04/14/2012   For f/u lab

## 2013-10-19 NOTE — Progress Notes (Signed)
Subjective:    Patient ID: Lori Harmon, female    DOB: 10/24/1970, 43 y.o.   MRN: [id number]  HPI  Here for wellness and f/u;  Overall doing ok;  Pt denies CP, worsening SOB, DOE, wheezing, orthopnea, PND, worsening LE edema, palpitations, dizziness or syncope.  Pt denies neurological change such as new headache, facial or extremity weakness.  Pt denies polydipsia, polyuria, or low sugar symptoms. Pt states overall good compliance with treatment and medications, good tolerability, and has been trying to follow lower cholesterol diet.  Pt denies worsening depressive symptoms, suicidal ideation or panic, except ofr mild worsening in the past wk, as she has been out of her celexa, no SI or HI.   No fever, night sweats, wt loss, loss of appetite, or other constitutional symptoms.  Pt states good ability with ADL's, has low fall risk, home safety reviewed and adequate, no other significant changes in hearing or vision, and only occasionally active with exercise.  Conts to work fulltime.  No new complaints Past Medical History  Diagnosis Date  . 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 SINUSITIS- ACUTE-NOS 01/30/2008  . HYPERTENSION 01/27/2007  . HYPERLIPIDEMIA 01/27/2007  . DIABETES MELLITUS, TYPE II 01/27/2007  . DEPRESSION 01/27/2007  . ANXIETY 01/27/2007  . ALLERGIC RHINITIS 01/27/2007  . Palpitations   . PCOS (polycystic ovarian syndrome)    No past surgical history on file.  reports that she has never smoked. She does not have any smokeless tobacco history on file. She reports that she drinks alcohol. She reports that she does not use illicit drugs. family history includes Alcohol abuse in her other and other; Cancer in her father, other, and other; Diabetes in her mother and other; Heart attack in her father and other; Hypertension in her other; Stroke in her other. No Known Allergies No current outpatient prescriptions on file prior to visit.   No current facility-administered medications on file  prior to visit.   Review of Systems Constitutional: Negative for increased diaphoresis, other activity, appetite or other siginficant weight change  HENT: Negative for worsening hearing loss, ear pain, facial swelling, mouth sores and neck stiffness.   Eyes: Negative for other worsening pain, redness or visual disturbance.  Respiratory: Negative for shortness of breath and wheezing.   Cardiovascular: Negative for chest pain and palpitations.  Gastrointestinal: Negative for diarrhea, blood in stool, abdominal distention or other pain Genitourinary: Negative for hematuria, flank pain or change in urine volume.  Musculoskeletal: Negative for myalgias or other joint complaints.  Skin: Negative for color change and wound.  Neurological: Negative for syncope and numbness. other than noted Hematological: Negative for adenopathy. or other swelling Psychiatric/Behavioral: Negative for hallucinations, self-injury, decreased concentration or other worsening agitation.      Objective:   Physical Exam BP 118/80  Pulse 97  Temp(Src) 98 F (36.7 C)  Ht 5' (1.524 m)  Wt 124 lb (56.246 kg)  BMI 24.22 kg/m2  SpO2 99% VS noted,  Constitutional: Pt is oriented to person, place, and time. Appears well-developed and well-nourished.  Head: Normocephalic and atraumatic.  Right Ear: External ear normal.  Left Ear: External ear normal.  Nose: Nose normal.  Mouth/Throat: Oropharynx is clear and moist.  Eyes: Conjunctivae and EOM are normal. Pupils are equal, round, and reactive to light.  Neck: Normal range of motion. Neck supple. No JVD present. No tracheal deviation present.  Cardiovascular: Normal rate, regular rhythm, normal heart sounds and intact distal pulses.   Pulmonary/Chest: Effort normal and breath  sounds without rales or wheezing  Abdominal: Soft. Bowel sounds are normal. NT. No HSM  Musculoskeletal: Normal range of motion. Exhibits no edema.  Lymphadenopathy:  Has no cervical adenopathy.    Neurological: Pt is alert and oriented to person, place, and time. Pt has normal reflexes. No cranial nerve deficit. Motor grossly intact Skin: Skin is warm and dry. No rash noted.  Psychiatric:  Has normal mood and affect. Behavior is normal.     Assessment & Plan:

## 2013-10-19 NOTE — Patient Instructions (Signed)

## 2013-10-19 NOTE — Progress Notes (Signed)
Pre visit review using our clinic review tool, if applicable. No additional management support is needed unless otherwise documented below in the visit note. 

## 2013-10-29 ENCOUNTER — Encounter: Payer: Self-pay | Admitting: Internal Medicine

## 2014-01-23 ENCOUNTER — Ambulatory Visit (INDEPENDENT_AMBULATORY_CARE_PROVIDER_SITE_OTHER): Payer: PRIVATE HEALTH INSURANCE | Admitting: Internal Medicine

## 2014-01-23 ENCOUNTER — Encounter: Payer: Self-pay | Admitting: Internal Medicine

## 2014-01-23 VITALS — BP 120/88 | HR 88 | Temp 97.9°F | Ht 60.0 in | Wt 124.5 lb

## 2014-01-23 DIAGNOSIS — I10 Essential (primary) hypertension: Secondary | ICD-10-CM | POA: Diagnosis not present

## 2014-01-23 DIAGNOSIS — F411 Generalized anxiety disorder: Secondary | ICD-10-CM

## 2014-01-23 DIAGNOSIS — F329 Major depressive disorder, single episode, unspecified: Secondary | ICD-10-CM | POA: Diagnosis not present

## 2014-01-23 DIAGNOSIS — F32A Depression, unspecified: Secondary | ICD-10-CM

## 2014-01-23 MED ORDER — ZOLPIDEM TARTRATE 10 MG PO TABS: 10.0000 mg | ORAL_TABLET | Freq: Every evening | ORAL | Status: DC | PRN

## 2014-01-23 MED ORDER — ESCITALOPRAM OXALATE 10 MG PO TABS: 10.0000 mg | ORAL_TABLET | Freq: Every day | ORAL | Status: DC

## 2014-01-23 NOTE — Assessment & Plan Note (Signed)
Ok for change celexa 20 to lexapro 10 for hopeful better clinical effect, ok to take celexa qod for 2 wks with start of lexapro to overlap to avoid lower mood in changeover. O/w consider prozac or zoloft, delcines counseling

## 2014-01-23 NOTE — Progress Notes (Signed)
   Subjective:    Patient ID: Lori Harmon, female    DOB: 04-29-1970, 44 y.o.   MRN: [id number]  HPI  Here to f/u, unfortunately with more low mood than up last 2 wks, difficutly getting to sleep despite advil PM and melatonin.  Has been on celexa for some years now.  Wondering about premenopause.  Still having menses reg every 30 days, essentially no change.  No unusual stressors, in fact has been some better after stepson moved out. Marriage ok, in fact she regrets giving him a hard time some times, feels quilty.  Work is not worse stressful. Pt denies chest pain, increased sob or doe, wheezing, orthopnea, PND, increased LE swelling, palpitations, dizziness or syncope Denies worsening suicidal ideation, or panic; has ongoing anxiety.  Did try the 40 mg celexa but not helpful, usually takes 20  Mg.  Reminds me she has tried benzo in past that caused agitation, does not want to try that again.   Past Medical History  Diagnosis Date  . 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 SINUSITIS- ACUTE-NOS 01/30/2008  . HYPERTENSION 01/27/2007  . HYPERLIPIDEMIA 01/27/2007  . DIABETES MELLITUS, TYPE II 01/27/2007  . DEPRESSION 01/27/2007  . ANXIETY 01/27/2007  . ALLERGIC RHINITIS 01/27/2007  . Palpitations   . PCOS (polycystic ovarian syndrome)    No past surgical history on file.  reports that she has never smoked. She does not have any smokeless tobacco history on file. She reports that she drinks alcohol. She reports that she does not use illicit drugs. family history includes Alcohol abuse in her other and other; Cancer in her father, other, and other; Diabetes in her mother and other; Heart attack in her father and other; Hypertension in her other; Stroke in her other. No Known Allergies Current Outpatient Prescriptions on File Prior to Visit  Medication Sig Dispense Refill  . citalopram (CELEXA) 40 MG tablet Take 1 tablet (40 mg total) by mouth daily. 90 tablet 3   No current facility-administered  medications on file prior to visit.   Review of Systems  Constitutional: Negative for unusual diaphoresis or other sweats  HENT: Negative for ringing in ear Eyes: Negative for double vision or worsening visual disturbance.  Respiratory: Negative for choking and stridor.   Gastrointestinal: Negative for vomiting or other signifcant bowel change Genitourinary: Negative for hematuria or decreased urine volume.  Musculoskeletal: Negative for other MSK pain or swelling Skin: Negative for color change and worsening wound.  Neurological: Negative for tremors and numbness other than noted  Psychiatric/Behavioral: Negative for decreased concentration or agitation other than above       Objective:   Physical Exam BP 120/88 mmHg  Pulse 88  Temp(Src) 97.9 F (36.6 C) (Oral)  Ht 5' (1.524 m)  Wt 124 lb 8 oz (56.473 kg)  BMI 24.31 kg/m2  SpO2 96% VS noted,  Constitutional: Pt appears well-developed, well-nourished.  HENT: Head: NCAT.  Right Ear: External ear normal.  Left Ear: External ear normal.  Eyes: . Pupils are equal, round, and reactive to light. Conjunctivae and EOM are normal Neck: Normal range of motion. Neck supple.  Cardiovascular: Normal rate and regular rhythm.   Pulmonary/Chest: Effort normal and breath sounds without rales or wheezing.  Neurological: Pt is alert. Not confused , motor grossly intact Skin: Skin is warm. No rash Psychiatric: Pt behavior is normal. No agitation. mild dysphoric, mild nervous    Assessment & Plan:

## 2014-01-23 NOTE — Progress Notes (Signed)
Pre visit review using our clinic review tool, if applicable. No additional management support is needed unless otherwise documented below in the visit note. 

## 2014-01-23 NOTE — Assessment & Plan Note (Signed)
With insomnia, for ambien qhs prn,  to f/u any worsening symptoms or concerns

## 2014-01-23 NOTE — Patient Instructions (Signed)
OK to wean off the celexa as instructed  Please take all new medication as prescribed - the lexapro, and ambien for sleep if needed  Please continue all other medications as before, and refills have been done if requested.  Please have the pharmacy call with any other refills you may need.  Please keep your appointments with your specialists as you may have planned  Please return about oct 2016 for your yearly exam.

## 2014-01-23 NOTE — Assessment & Plan Note (Signed)
stable overall by history and exam, recent data reviewed with pt, and pt to continue medical treatment as before,  to f/u any worsening symptoms or concerns BP Readings from Last 3 Encounters:  01/23/14 120/88  10/19/13 118/80  04/14/12 108/80

## 2014-01-24 ENCOUNTER — Telehealth: Payer: Self-pay | Admitting: Internal Medicine

## 2014-01-24 NOTE — Telephone Encounter (Signed)
emmi emailed °

## 2014-03-18 ENCOUNTER — Telehealth: Payer: Self-pay | Admitting: Internal Medicine

## 2014-03-18 NOTE — Telephone Encounter (Signed)
Rec'd from [facility] forward 5 pages to [place]

## 2014-05-03 ENCOUNTER — Telehealth: Payer: Self-pay | Admitting: Internal Medicine

## 2014-05-03 NOTE — Telephone Encounter (Signed)
Received records from [facility] sent, sent to Dr. Cathlean Cower 05/03/14 fbg.

## 2014-10-23 ENCOUNTER — Ambulatory Visit (INDEPENDENT_AMBULATORY_CARE_PROVIDER_SITE_OTHER): Payer: PRIVATE HEALTH INSURANCE | Admitting: Internal Medicine

## 2014-10-23 ENCOUNTER — Other Ambulatory Visit (INDEPENDENT_AMBULATORY_CARE_PROVIDER_SITE_OTHER): Payer: PRIVATE HEALTH INSURANCE

## 2014-10-23 ENCOUNTER — Encounter: Payer: Self-pay | Admitting: Internal Medicine

## 2014-10-23 VITALS — BP 130/90 | HR 80 | Ht 60.0 in | Wt 115.0 lb

## 2014-10-23 DIAGNOSIS — Z0000 Encounter for general adult medical examination without abnormal findings: Secondary | ICD-10-CM | POA: Diagnosis not present

## 2014-10-23 DIAGNOSIS — R7302 Impaired glucose tolerance (oral): Secondary | ICD-10-CM

## 2014-10-23 DIAGNOSIS — I10 Essential (primary) hypertension: Secondary | ICD-10-CM

## 2014-10-23 DIAGNOSIS — Q969 Turner's syndrome, unspecified: Secondary | ICD-10-CM

## 2014-10-23 LAB — BASIC METABOLIC PANEL
BUN: 13 mg/dL (ref 6–23)
CALCIUM: 10.2 mg/dL (ref 8.4–10.5)
CO2: 26 mEq/L (ref 19–32)
Chloride: 105 mEq/L (ref 96–112)
Creatinine, Ser: 0.68 mg/dL (ref 0.40–1.20)
GFR: 99.64 mL/min (ref >60.00)
GLUCOSE: 92 mg/dL (ref 70–99)
POTASSIUM: 4.1 meq/L (ref 3.5–5.1)
SODIUM: 139 meq/L (ref 135–145)

## 2014-10-23 LAB — URINALYSIS, ROUTINE W REFLEX MICROSCOPIC
BILIRUBIN URINE: NEGATIVE
Hgb urine dipstick: NEGATIVE
KETONES UR: NEGATIVE
LEUKOCYTES UA: NEGATIVE
NITRITE: NEGATIVE
Specific Gravity, Urine: 1.01 (ref 1.000–1.030)
Total Protein, Urine: NEGATIVE
Urine Glucose: NEGATIVE
Urobilinogen, UA: 0.2 (ref 0.0–1.0)
pH: 7 (ref 5.0–8.0)

## 2014-10-23 LAB — LIPID PANEL
CHOL/HDL RATIO: 4
CHOLESTEROL: 187 mg/dL (ref 0–200)
HDL: 52.2 mg/dL (ref >39.00)
LDL CALC: 107 mg/dL — AB (ref 0–99)
NonHDL: 135.01
Triglycerides: 141 mg/dL (ref 0.0–149.0)
VLDL: 28.2 mg/dL (ref 0.0–40.0)

## 2014-10-23 LAB — CBC WITH DIFFERENTIAL/PLATELET
BASOS PCT: 0.4 % (ref 0.0–3.0)
Basophils Absolute: 0 10*3/uL (ref 0.0–0.1)
EOS PCT: 3 % (ref 0.0–5.0)
Eosinophils Absolute: 0.2 10*3/uL (ref 0.0–0.7)
HCT: 41.5 % (ref 36.0–46.0)
HEMOGLOBIN: 14.1 g/dL (ref 12.0–15.0)
Lymphocytes Relative: 29.1 % (ref 12.0–46.0)
Lymphs Abs: 1.9 10*3/uL (ref 0.7–4.0)
MCHC: 34 g/dL (ref 30.0–36.0)
MCV: 91.3 fl (ref 78.0–100.0)
MONOS PCT: 11 % (ref 3.0–12.0)
Monocytes Absolute: 0.7 10*3/uL (ref 0.1–1.0)
Neutro Abs: 3.7 10*3/uL (ref 1.4–7.7)
Neutrophils Relative %: 56.5 % (ref 43.0–77.0)
Platelets: 273 10*3/uL (ref 150.0–400.0)
RBC: 4.55 Mil/uL (ref 3.87–5.11)
RDW: 12.7 % (ref 11.5–15.5)
WBC: 6.5 10*3/uL (ref 4.0–10.5)

## 2014-10-23 LAB — HEPATIC FUNCTION PANEL
ALBUMIN: 4.2 g/dL (ref 3.5–5.2)
ALK PHOS: 38 U/L — AB (ref 39–117)
ALT: 7 U/L (ref 0–35)
AST: 13 U/L (ref 0–37)
BILIRUBIN TOTAL: 0.4 mg/dL (ref 0.2–1.2)
Bilirubin, Direct: 0.1 mg/dL (ref 0.0–0.3)
Total Protein: 7.4 g/dL (ref 6.0–8.3)

## 2014-10-23 LAB — HEMOGLOBIN A1C: Hgb A1c MFr Bld: 5.4 % (ref 4.6–6.5)

## 2014-10-23 LAB — TSH: TSH: 1.51 u[IU]/mL (ref 0.35–4.50)

## 2014-10-23 MED ORDER — ESCITALOPRAM OXALATE 10 MG PO TABS
10.0000 mg | ORAL_TABLET | Freq: Every day | ORAL | Status: DC
Start: 2014-10-23 — End: 2015-10-24

## 2014-10-23 NOTE — Assessment & Plan Note (Signed)
Diet controlled to date, stable overall by history and exam, recent data reviewed with pt, and pt to continue medical treatment as before,  to f/u any worsening symptoms or concerns Lab Results  Component Value Date   HGBA1C 5.8 10/19/2013   For f/u lab

## 2014-10-23 NOTE — Assessment & Plan Note (Signed)

## 2014-10-23 NOTE — Assessment & Plan Note (Signed)
Echo 2014 reveiwed with pt, will plan for likely echo repeat next yr, but also screen celiac labs this yr,  to f/u any worsening symptoms or concerns

## 2014-10-23 NOTE — Progress Notes (Signed)
Subjective:    Patient ID: Lori Harmon, female    DOB: 1970-01-24, 44 y.o.   MRN: [id number]  HPI  Here for wellness and f/u;  Overall doing ok;  Pt denies Chest pain, worsening SOB, DOE, wheezing, orthopnea, PND, worsening LE edema, palpitations, dizziness or syncope.  Pt denies neurological change such as new headache, facial or extremity weakness.  Pt denies polydipsia, polyuria, or low sugar symptoms. Pt states overall good compliance with treatment and medications, good tolerability, and has been trying to follow appropriate diet.  Pt denies worsening depressive symptoms, suicidal ideation or panic. No fever, night sweats, wt loss, loss of appetite, or other constitutional symptoms.  Pt states good ability with ADL's, has low fall risk, home safety reviewed and adequate, no other significant changes in hearing or vision, and only occasionally active with exercise. Has ongoing mild acne, has seen derm in past BP Readings from Last 3 Encounters:  10/23/14 130/90  01/23/14 120/88  10/19/13 118/80  Denies worsening reflux, abd pain, dysphagia, n/v, bowel change or blood. Past Medical History  Diagnosis Date  . 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 SINUSITIS- ACUTE-NOS 01/30/2008  . HYPERTENSION 01/27/2007  . HYPERLIPIDEMIA 01/27/2007  . DIABETES MELLITUS, TYPE II 01/27/2007  . DEPRESSION 01/27/2007  . ANXIETY 01/27/2007  . ALLERGIC RHINITIS 01/27/2007  . Palpitations   . PCOS (polycystic ovarian syndrome)    No past surgical history on file.  reports that she has never smoked. She does not have any smokeless tobacco history on file. She reports that she drinks alcohol. She reports that she does not use illicit drugs. family history includes Alcohol abuse in her other and other; Cancer in her father, other, and other; Diabetes in her mother and other; Heart attack in her father and other; Hypertension in her other; Stroke in her other. No Known Allergies Current Outpatient Prescriptions on File  Prior to Visit  Medication Sig Dispense Refill  . escitalopram (LEXAPRO) 10 MG tablet Take 1 tablet (10 mg total) by mouth daily. 90 tablet 3  . zolpidem (AMBIEN) 10 MG tablet Take 1 tablet (10 mg total) by mouth at bedtime as needed for sleep. 30 tablet 5   No current facility-administered medications on file prior to visit.   Review of Systems Constitutional: Negative for increased diaphoresis, other activity, appetite or siginficant weight change other than noted HENT: Negative for worsening hearing loss, ear pain, facial swelling, mouth sores and neck stiffness.   Eyes: Negative for other worsening pain, redness or visual disturbance.  Respiratory: Negative for shortness of breath and wheezing  Cardiovascular: Negative for chest pain and palpitations.  Gastrointestinal: Negative for diarrhea, blood in stool, abdominal distention or other pain Genitourinary: Negative for hematuria, flank pain or change in urine volume.  Musculoskeletal: Negative for myalgias or other joint complaints.  Skin: Negative for color change and wound or drainage.  Neurological: Negative for syncope and numbness. other than noted Hematological: Negative for adenopathy. or other swelling Psychiatric/Behavioral: Negative for hallucinations, SI, self-injury, decreased concentration or other worsening agitation.      Objective:   Physical Exam BP 130/90 mmHg  Pulse 80  Ht 5' (1.524 m)  Wt 115 lb (52.164 kg)  BMI 22.46 kg/m2  SpO2 98% VS noted,  Constitutional: Pt is oriented to person, place, and time. Appears well-developed and well-nourished, in no significant distress Head: Normocephalic and atraumatic.  Right Ear: External ear normal.  Left Ear: External ear normal.  Nose: Nose normal.  Mouth/Throat: Oropharynx is  clear and moist.  Eyes: Conjunctivae and EOM are normal. Pupils are equal, round, and reactive to light.  Neck: Normal range of motion. Neck supple. No JVD present. No tracheal deviation  present or significant neck LA or mass Cardiovascular: Normal rate, regular rhythm, normal heart sounds and intact distal pulses.   Pulmonary/Chest: Effort normal and breath sounds without rales or wheezing  Abdominal: Soft. Bowel sounds are normal. NT. No HSM  Musculoskeletal: Normal range of motion. Exhibits no edema.  Lymphadenopathy:  Has no cervical adenopathy.  Neurological: Pt is alert and oriented to person, place, and time. Pt has normal reflexes. No cranial nerve deficit. Motor grossly intact Skin: Skin is warm and dry. No rash noted.  Psychiatric:  Has normal mood and affect. Behavior is normal.      Assessment & Plan:

## 2014-10-23 NOTE — Assessment & Plan Note (Signed)
Has been diet and wt controlled, but may be trendin up in last 2-3 yrs, will cont to monitor with monthly home BP checks, f/u with > 140/90

## 2014-10-23 NOTE — Progress Notes (Signed)
Pre visit review using our clinic review tool, if applicable. No additional management support is needed unless otherwise documented below in the visit note. 

## 2014-10-23 NOTE — Patient Instructions (Signed)
Please to check your Blood Pressure once per month, and return for > 140/90  Please continue all other medications as before, and refills have been done if requested.  Please have the pharmacy call with any other refills you may need.  Please continue your efforts at being more active, low cholesterol diet, and weight control.  You are otherwise up to date with prevention measures today.  Please keep your appointments with your specialists as you may have planned  Please go to the LAB in the Basement (turn left off the elevator) for the tests to be done today  You will be contacted by phone if any changes need to be made immediately.  Otherwise, you will receive a letter about your results with an explanation, but please check with MyChart first.  Please remember to sign up for MyChart if you have not done so, as this will be important to you in the future with finding out test results, communicating by private email, and scheduling acute appointments online when needed.  Please return in 1 year for your yearly visit, or sooner if needed, with Lab testing done 3-5 days before

## 2015-08-13 ENCOUNTER — Other Ambulatory Visit: Payer: Self-pay | Admitting: Internal Medicine

## 2015-08-13 ENCOUNTER — Other Ambulatory Visit (INDEPENDENT_AMBULATORY_CARE_PROVIDER_SITE_OTHER): Payer: PRIVATE HEALTH INSURANCE

## 2015-08-13 ENCOUNTER — Encounter: Payer: Self-pay | Admitting: Internal Medicine

## 2015-08-13 ENCOUNTER — Ambulatory Visit (INDEPENDENT_AMBULATORY_CARE_PROVIDER_SITE_OTHER): Payer: PRIVATE HEALTH INSURANCE | Admitting: Internal Medicine

## 2015-08-13 VITALS — BP 112/80 | HR 85 | Temp 98.0°F | Resp 12 | Ht 65.0 in | Wt 123.0 lb

## 2015-08-13 DIAGNOSIS — R300 Dysuria: Secondary | ICD-10-CM | POA: Diagnosis not present

## 2015-08-13 DIAGNOSIS — R319 Hematuria, unspecified: Secondary | ICD-10-CM

## 2015-08-13 LAB — COMPREHENSIVE METABOLIC PANEL
ALBUMIN: 4.4 g/dL (ref 3.5–5.2)
ALK PHOS: 52 U/L (ref 39–117)
ALT: 11 U/L (ref 0–35)
AST: 14 U/L (ref 0–37)
BUN: 7 mg/dL (ref 6–23)
CO2: 27 mEq/L (ref 19–32)
CREATININE: 0.74 mg/dL (ref 0.40–1.20)
Calcium: 9.8 mg/dL (ref 8.4–10.5)
Chloride: 102 mEq/L (ref 96–112)
GFR: 90.05 mL/min (ref >60.00)
Glucose, Bld: 97 mg/dL (ref 70–99)
Potassium: 3.8 mEq/L (ref 3.5–5.1)
SODIUM: 136 meq/L (ref 135–145)
TOTAL PROTEIN: 7.7 g/dL (ref 6.0–8.3)
Total Bilirubin: 0.4 mg/dL (ref 0.2–1.2)

## 2015-08-13 LAB — POCT URINALYSIS DIPSTICK
Bilirubin, UA: NEGATIVE
Blood, UA: NEGATIVE
Glucose, UA: NEGATIVE
KETONES UA: NEGATIVE
Leukocytes, UA: NEGATIVE
Nitrite, UA: NEGATIVE
PROTEIN UA: NEGATIVE
SPEC GRAV UA: 1.015
Urobilinogen, UA: NEGATIVE
pH, UA: 7

## 2015-08-13 LAB — CBC
HEMATOCRIT: 40.8 % (ref 36.0–46.0)
Hemoglobin: 14.1 g/dL (ref 12.0–15.0)
MCHC: 34.5 g/dL (ref 30.0–36.0)
MCV: 90.2 fl (ref 78.0–100.0)
Platelets: 284 10*3/uL (ref 150.0–400.0)
RBC: 4.53 Mil/uL (ref 3.87–5.11)
RDW: 12.9 % (ref 11.5–15.5)
WBC: 7.6 10*3/uL (ref 4.0–10.5)

## 2015-08-13 NOTE — Assessment & Plan Note (Signed)
With wiping not during her menstrual cycle. POC u/a in the office without blood. Sending for culture to rule out infection. No symptoms of kidney stone. If she is having more in the future may need urology referral. Also checking CMP and CBC.

## 2015-08-13 NOTE — Progress Notes (Signed)
Pre visit review using our clinic review tool, if applicable. No additional management support is needed unless otherwise documented below in the visit note. 

## 2015-08-13 NOTE — Progress Notes (Signed)
   Subjective:    Patient ID: Lori Harmon, female    DOB: 1970/03/26, 45 y.o.   MRN: [id number]  HPI The patient is a 45 YO female coming in for possible UTI. She has been having symptoms for the last several days. Started drinking more water since that time. Some burning with urination. Some hematuria with wiping. No bleeding in between and not typical for her menstrual cycle and is on birth control without missing. No fevers or chills. Had a URI the week prior which is now resolved.   Review of Systems  Constitutional: Negative for activity change, appetite change, fatigue and fever.  HENT: Negative.   Respiratory: Negative.   Cardiovascular: Negative.   Gastrointestinal: Negative.   Genitourinary: Positive for dysuria, frequency and hematuria.  Musculoskeletal: Negative.   Skin: Negative.       Objective:   Physical Exam  Constitutional: She appears well-developed and well-nourished.  HENT:  Head: Normocephalic and atraumatic.  Eyes: EOM are normal.  Neck: Normal range of motion.  Cardiovascular: Normal rate and regular rhythm.   Pulmonary/Chest: Effort normal. No respiratory distress. She has no wheezes. She has no rales.  Abdominal: Soft. Bowel sounds are normal. She exhibits no distension. There is no tenderness. There is no rebound.  Musculoskeletal: She exhibits no tenderness.  No tenderness or flank pain  Skin: Skin is warm and dry.   Vitals:   08/13/15 0848  BP: 112/80  Pulse: 85  Resp: 12  Temp: 98 F (36.7 C)  TempSrc: Oral  SpO2: 99%  Weight: 123 lb (55.8 kg)  Height: 5\' 5"  (1.651 m)      Assessment & Plan:

## 2015-08-13 NOTE — Patient Instructions (Signed)
We are going to send the urine off for culture and let you know about the results. We are also going to check the labs today and make sure they are normal.

## 2015-08-15 LAB — URINE CULTURE

## 2015-08-15 LAB — CULTURE, URINE COMPREHENSIVE: Colony Count: >=100000

## 2015-10-23 ENCOUNTER — Encounter: Payer: PRIVATE HEALTH INSURANCE | Admitting: Internal Medicine

## 2015-10-24 ENCOUNTER — Other Ambulatory Visit: Payer: Self-pay | Admitting: Internal Medicine

## 2015-11-13 ENCOUNTER — Encounter: Payer: Self-pay | Admitting: Internal Medicine

## 2015-11-13 MED ORDER — CLONAZEPAM 0.5 MG PO TABS: 0.5000 mg | ORAL_TABLET | Freq: Two times a day (BID) | ORAL | 1 refills | Status: DC | PRN

## 2015-11-13 NOTE — Telephone Encounter (Signed)
Done hardcopy to Corinne  

## 2015-12-17 ENCOUNTER — Encounter: Payer: Self-pay | Admitting: Internal Medicine

## 2015-12-17 ENCOUNTER — Ambulatory Visit (INDEPENDENT_AMBULATORY_CARE_PROVIDER_SITE_OTHER): Payer: PRIVATE HEALTH INSURANCE | Admitting: Internal Medicine

## 2015-12-17 ENCOUNTER — Other Ambulatory Visit (INDEPENDENT_AMBULATORY_CARE_PROVIDER_SITE_OTHER): Payer: PRIVATE HEALTH INSURANCE

## 2015-12-17 VITALS — BP 128/78 | HR 73 | Temp 98.4°F | Resp 20 | Wt 126.0 lb

## 2015-12-17 DIAGNOSIS — I10 Essential (primary) hypertension: Secondary | ICD-10-CM | POA: Diagnosis not present

## 2015-12-17 DIAGNOSIS — Z0000 Encounter for general adult medical examination without abnormal findings: Secondary | ICD-10-CM

## 2015-12-17 DIAGNOSIS — R7302 Impaired glucose tolerance (oral): Secondary | ICD-10-CM

## 2015-12-17 LAB — CBC WITH DIFFERENTIAL/PLATELET
Basophils Absolute: 0 10*3/uL (ref 0.0–0.1)
Basophils Relative: 0.6 % (ref 0.0–3.0)
EOS PCT: 3.6 % (ref 0.0–5.0)
Eosinophils Absolute: 0.3 10*3/uL (ref 0.0–0.7)
HCT: 43.6 % (ref 36.0–46.0)
Hemoglobin: 15 g/dL (ref 12.0–15.0)
LYMPHS ABS: 2.1 10*3/uL (ref 0.7–4.0)
Lymphocytes Relative: 28.9 % (ref 12.0–46.0)
MCHC: 34.5 g/dL (ref 30.0–36.0)
MCV: 90.5 fl (ref 78.0–100.0)
MONO ABS: 0.7 10*3/uL (ref 0.1–1.0)
MONOS PCT: 9.9 % (ref 3.0–12.0)
NEUTROS ABS: 4 10*3/uL (ref 1.4–7.7)
NEUTROS PCT: 57 % (ref 43.0–77.0)
PLATELETS: 305 10*3/uL (ref 150.0–400.0)
RBC: 4.81 Mil/uL (ref 3.87–5.11)
RDW: 12.6 % (ref 11.5–15.5)
WBC: 7.1 10*3/uL (ref 4.0–10.5)

## 2015-12-17 LAB — BASIC METABOLIC PANEL
BUN: 9 mg/dL (ref 6–23)
CALCIUM: 9.9 mg/dL (ref 8.4–10.5)
CO2: 27 meq/L (ref 19–32)
Chloride: 104 mEq/L (ref 96–112)
Creatinine, Ser: 0.72 mg/dL (ref 0.40–1.20)
GFR: 92.8 mL/min (ref >60.00)
GLUCOSE: 117 mg/dL — AB (ref 70–99)
POTASSIUM: 4.1 meq/L (ref 3.5–5.1)
SODIUM: 139 meq/L (ref 135–145)

## 2015-12-17 LAB — HEPATIC FUNCTION PANEL
ALK PHOS: 47 U/L (ref 39–117)
ALT: 15 U/L (ref 0–35)
AST: 20 U/L (ref 0–37)
Albumin: 4.6 g/dL (ref 3.5–5.2)
BILIRUBIN DIRECT: 0.1 mg/dL (ref 0.0–0.3)
Total Bilirubin: 0.5 mg/dL (ref 0.2–1.2)
Total Protein: 7.7 g/dL (ref 6.0–8.3)

## 2015-12-17 LAB — LIPID PANEL
Cholesterol: 175 mg/dL (ref 0–200)
HDL: 47.5 mg/dL (ref >39.00)
LDL CALC: 93 mg/dL (ref 0–99)
NONHDL: 127.7
Total CHOL/HDL Ratio: 4
Triglycerides: 176 mg/dL — ABNORMAL HIGH (ref 0.0–149.0)
VLDL: 35.2 mg/dL (ref 0.0–40.0)

## 2015-12-17 LAB — URINALYSIS, ROUTINE W REFLEX MICROSCOPIC
Bilirubin Urine: NEGATIVE
HGB URINE DIPSTICK: NEGATIVE
Ketones, ur: NEGATIVE
Leukocytes, UA: NEGATIVE
Nitrite: NEGATIVE
PH: 7 (ref 5.0–8.0)
SPECIFIC GRAVITY, URINE: 1.015 (ref 1.000–1.030)
TOTAL PROTEIN, URINE-UPE24: NEGATIVE
Urine Glucose: NEGATIVE
Urobilinogen, UA: 0.2 (ref 0.0–1.0)

## 2015-12-17 LAB — HEMOGLOBIN A1C: HEMOGLOBIN A1C: 5.9 % (ref 4.6–6.5)

## 2015-12-17 LAB — TSH: TSH: 1.11 u[IU]/mL (ref 0.35–4.50)

## 2015-12-17 MED ORDER — ESCITALOPRAM OXALATE 10 MG PO TABS: 10.0000 mg | ORAL_TABLET | Freq: Every day | ORAL | 3 refills | Status: DC

## 2015-12-17 MED ORDER — CLONAZEPAM 0.5 MG PO TABS: 0.5000 mg | ORAL_TABLET | Freq: Two times a day (BID) | ORAL | 2 refills | Status: DC | PRN

## 2015-12-17 NOTE — Patient Instructions (Signed)

## 2015-12-17 NOTE — Progress Notes (Signed)
Pre visit review using our clinic review tool, if applicable. No additional management support is needed unless otherwise documented below in the visit note. 

## 2015-12-17 NOTE — Progress Notes (Signed)
Subjective:    Patient ID: Rennis Chris, female    DOB: May 03, 1970, 45 y.o.   MRN: [id number]  HPI  Here for wellness and f/u;  Overall doing ok;  Pt denies Chest pain, worsening SOB, DOE, wheezing, orthopnea, PND, worsening LE edema, palpitations, dizziness or syncope.  Pt denies neurological change such as new headache, facial or extremity weakness.  Pt denies polydipsia, polyuria, or low sugar symptoms. Pt states overall good compliance with treatment and medications, good tolerability, and has been trying to follow appropriate diet.  Pt denies worsening depressive symptoms, suicidal ideation or panic. No fever, night sweats, wt loss, loss of appetite, or other constitutional symptoms.  Pt states good ability with ADL's, has low fall risk, home safety reviewed and adequate, no other significant changes in hearing or vision, and only occasionally active with exercise compared to last yr due to stressors.  Wt has increased about 10 lbs. Wt Readings from Last 3 Encounters:  12/17/15 126 lb (57.2 kg)  08/13/15 123 lb (55.8 kg)  10/23/14 115 lb (52.2 kg)  Currently having to deal with stress over stepson's wife and 2 daughters in the home for several months, but klonopin working well., not having to take every day, just some days.  No other history findings Past Medical History:  Diagnosis Date  . ALLERGIC RHINITIS 01/27/2007  . ANXIETY 01/27/2007  . DEPRESSION 01/27/2007  . DIABETES MELLITUS, TYPE II 01/27/2007  . HYPERLIPIDEMIA 01/27/2007  . HYPERTENSION 01/27/2007  . Palpitations   . PCOS (polycystic ovarian syndrome)   . SINUSITIS- ACUTE-NOS 01/30/2008  . 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  reports that she has never smoked. She does not have any smokeless tobacco history on file. She reports that she drinks alcohol. She reports that she does not use drugs. family history includes Alcohol abuse in her other and other; Cancer in her father, other, and other;  Diabetes in her mother and other; Heart attack in her father and other; Hypertension in her other; Stroke in her other. No Known Allergies Current Outpatient Prescriptions on File Prior to Visit  Medication Sig Dispense Refill  . Norgestim-Eth Estrad Triphasic (ORTHO TRI-CYCLEN, 28, PO) Take 1 tablet by mouth daily.    Marland Kitchen zolpidem (AMBIEN) 10 MG tablet Take 1 tablet (10 mg total) by mouth at bedtime as needed for sleep. 30 tablet 5   No current facility-administered medications on file prior to visit.    Review of Systems Constitutional: Negative for increased diaphoresis, or other activity, appetite or siginficant weight change other than noted HENT: Negative for worsening hearing loss, ear pain, facial swelling, mouth sores and neck stiffness.   Eyes: Negative for other worsening pain, redness or visual disturbance.  Respiratory: Negative for choking or stridor Cardiovascular: Negative for other chest pain and palpitations.  Gastrointestinal: Negative for worsening diarrhea, blood in stool, or abdominal distention Genitourinary: Negative for hematuria, flank pain or change in urine volume.  Musculoskeletal: Negative for myalgias or other joint complaints.  Skin: Negative for other color change and wound or drainage.  Neurological: Negative for syncope and numbness. other than noted Hematological: Negative for adenopathy. or other swelling Psychiatric/Behavioral: Negative for hallucinations, SI, self-injury, decreased concentration or other worsening agitation.  All other system neg per pt    Objective:   Physical Exam BP 128/78   Pulse 73   Temp 98.4 F (36.9 C) (Oral)   Resp 20   Wt 126 lb (57.2 kg)  SpO2 98%   BMI 20.97 kg/m  VS noted,  Constitutional: Pt is oriented to person, place, and time. Appears well-developed and well-nourished, in no significant distress Head: Normocephalic and atraumatic  Eyes: Conjunctivae and EOM are normal. Pupils are equal, round, and reactive  to light Right Ear: External ear normal.  Left Ear: External ear normal Nose: Nose normal.  Mouth/Throat: Oropharynx is clear and moist  Neck: Normal range of motion. Neck supple. No JVD present. No tracheal deviation present or significant neck LA or mass Cardiovascular: Normal rate, regular rhythm, normal heart sounds and intact distal pulses.   Pulmonary/Chest: Effort normal and breath sounds without rales or wheezing  Abdominal: Soft. Bowel sounds are normal. NT. No HSM  Musculoskeletal: Normal range of motion. Exhibits no edema Lymphadenopathy: Has no cervical adenopathy.  Neurological: Pt is alert and oriented to person, place, and time. Pt has normal reflexes. No cranial nerve deficit. Motor grossly intact Skin: Skin is warm and dry. No rash noted or new ulcers Psychiatric:  Has normal mood and affect. Behavior is normal.  No other new exam findings      Assessment & Plan:

## 2015-12-18 ENCOUNTER — Other Ambulatory Visit (INDEPENDENT_AMBULATORY_CARE_PROVIDER_SITE_OTHER): Payer: PRIVATE HEALTH INSURANCE

## 2015-12-18 DIAGNOSIS — R7309 Other abnormal glucose: Secondary | ICD-10-CM

## 2015-12-18 DIAGNOSIS — R7302 Impaired glucose tolerance (oral): Secondary | ICD-10-CM | POA: Diagnosis not present

## 2015-12-18 DIAGNOSIS — Z0000 Encounter for general adult medical examination without abnormal findings: Secondary | ICD-10-CM | POA: Diagnosis not present

## 2015-12-18 LAB — HEMOGLOBIN A1C: HEMOGLOBIN A1C: 5.9 % (ref 4.6–6.5)

## 2015-12-21 NOTE — Assessment & Plan Note (Signed)
stable overall by history and exam, recent data reviewed with pt, and pt to continue medical treatment as before,  to f/u any worsening symptoms or concerns Lab Results  Component Value Date   HGBA1C 5.9 12/18/2015    

## 2015-12-21 NOTE — Assessment & Plan Note (Signed)

## 2015-12-21 NOTE — Assessment & Plan Note (Signed)
stable overall by history and exam, recent data reviewed with pt, and pt to continue medical treatment as before,  to f/u any worsening symptoms or concerns BP Readings from Last 3 Encounters:  12/17/15 128/78  08/13/15 112/80  10/23/14 130/90

## 2016-05-26 ENCOUNTER — Other Ambulatory Visit (INDEPENDENT_AMBULATORY_CARE_PROVIDER_SITE_OTHER): Payer: PRIVATE HEALTH INSURANCE

## 2016-05-26 ENCOUNTER — Ambulatory Visit (INDEPENDENT_AMBULATORY_CARE_PROVIDER_SITE_OTHER): Payer: PRIVATE HEALTH INSURANCE | Admitting: Internal Medicine

## 2016-05-26 ENCOUNTER — Encounter: Payer: Self-pay | Admitting: Internal Medicine

## 2016-05-26 VITALS — BP 148/90 | HR 91 | Ht 60.0 in | Wt 121.0 lb

## 2016-05-26 DIAGNOSIS — R7302 Impaired glucose tolerance (oral): Secondary | ICD-10-CM

## 2016-05-26 DIAGNOSIS — F411 Generalized anxiety disorder: Secondary | ICD-10-CM | POA: Diagnosis not present

## 2016-05-26 DIAGNOSIS — R8290 Unspecified abnormal findings in urine: Secondary | ICD-10-CM

## 2016-05-26 DIAGNOSIS — I10 Essential (primary) hypertension: Secondary | ICD-10-CM

## 2016-05-26 LAB — BASIC METABOLIC PANEL
BUN: 10 mg/dL (ref 6–23)
CO2: 24 mEq/L (ref 19–32)
Calcium: 10 mg/dL (ref 8.4–10.5)
Chloride: 105 mEq/L (ref 96–112)
Creatinine, Ser: 0.7 mg/dL (ref 0.40–1.20)
GFR: 95.68 mL/min (ref >60.00)
GLUCOSE: 95 mg/dL (ref 70–99)
Potassium: 3.6 mEq/L (ref 3.5–5.1)
SODIUM: 138 meq/L (ref 135–145)

## 2016-05-26 NOTE — Progress Notes (Signed)
Subjective:    Patient ID: Lori Harmon, female    DOB: 1970/10/23, 46 y.o.   MRN: [id number]  HPI    Here with abnormal smell ammonia like to urine for several day, she thinks means ? Dehydration, and hsa been drinking plenty of fluids during the day yesterday and today so by end of day urine is more like usual dilute.  Wondering why might be dehydrated and not drinking ETOH recently.  No personal hx DM but concerned she may have this as several in family have DM. Pt denies polydipsia, polyuria,other than above Denies urinary symptoms such as dysuria, frequency, urgency, flank pain, hematuria or n/v, fever, chills. Pt denies chest pain, increased sob or doe, wheezing, orthopnea, PND, increased LE swelling, palpitations, dizziness or syncope.  Also mood and personality has changed per family per pt today, she thinks due to stressors increased recently.  Denies worsening depressive symptoms, suicidal ideation, or panic; has ongoing anxiety.  States mother has hx of lower sugar and gets angry and irrational.  Klonopin to her seems to cause too much down days after taking, does not want to take further.  Seems to have lost wt with better diet but also increased stress.   Wt Readings from Last 3 Encounters:  05/26/16 121 lb (54.9 kg)  12/17/15 126 lb (57.2 kg)  08/13/15 123 lb (55.8 kg)   BP Readings from Last 3 Encounters:  05/26/16 (!) 148/90  12/17/15 128/78  08/13/15 112/80   Past Medical History:  Diagnosis Date  . ALLERGIC RHINITIS 01/27/2007  . ANXIETY 01/27/2007  . DEPRESSION 01/27/2007  . DIABETES MELLITUS, TYPE II 01/27/2007  . HYPERLIPIDEMIA 01/27/2007  . HYPERTENSION 01/27/2007  . Palpitations   . PCOS (polycystic ovarian syndrome)   . SINUSITIS- ACUTE-NOS 01/30/2008  . 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  reports that she has never smoked. She has never used smokeless tobacco. She reports that she drinks alcohol. She reports that she does not use  drugs. family history includes Alcohol abuse in her other and other; Cancer in her father, other, and other; Diabetes in her mother and other; Heart attack in her father and other; Hypertension in her other; Stroke in her other. No Known Allergies Current Outpatient Prescriptions on File Prior to Visit  Medication Sig Dispense Refill  . clonazePAM (KLONOPIN) 0.5 MG tablet Take 1 tablet (0.5 mg total) by mouth 2 (two) times daily as needed for anxiety. To fill Jan 12, 2016 60 tablet 2  . escitalopram (LEXAPRO) 10 MG tablet Take 1 tablet (10 mg total) by mouth daily. 90 tablet 3  . Norgestim-Eth Estrad Triphasic (ORTHO TRI-CYCLEN, 28, PO) Take 1 tablet by mouth daily.    Marland Kitchen zolpidem (AMBIEN) 10 MG tablet Take 1 tablet (10 mg total) by mouth at bedtime as needed for sleep. 30 tablet 5   No current facility-administered medications on file prior to visit.    Review of Systems  Constitutional: Negative for other unusual diaphoresis or sweats HENT: Negative for ear discharge or swelling Eyes: Negative for other worsening visual disturbances Respiratory: Negative for stridor or other swelling  Gastrointestinal: Negative for worsening distension or other blood Genitourinary: Negative for retention or other urinary change Musculoskeletal: Negative for other MSK pain or swelling Skin: Negative for color change or other new lesions Neurological: Negative for worsening tremors and other numbness  Psychiatric/Behavioral: Negative for worsening agitation or other fatigue All other system neg per pt    Objective:  Physical Exam BP (!) 148/90   Pulse 91   Ht 5' (1.524 m)   Wt 121 lb (54.9 kg)   SpO2 99%   BMI 23.63 kg/m  VS noted,  Constitutional: Pt appears in NAD HENT: Head: NCAT.  Right Ear: External ear normal.  Left Ear: External ear normal.  Eyes: . Pupils are equal, round, and reactive to light. Conjunctivae and EOM are normal Nose: without d/c or deformity Neck: Neck supple. Gross  normal ROM Cardiovascular: Normal rate and regular rhythm.   Pulmonary/Chest: Effort normal and breath sounds without rales or wheezing.  Abd:  Soft, NT, ND, + BS, no organomegaly Neurological: Pt is alert. At baseline orientation, motor grossly intact Skin: Skin is warm. No rashes, other new lesions, no LE edema Psychiatric: Pt behavior is normal without agitation but 1-2+ nervous No other exam fidings   Lab Results  Component Value Date   WBC 7.1 12/17/2015   HGB 15.0 12/17/2015   HCT 43.6 12/17/2015   PLT 305.0 12/17/2015   GLUCOSE 117 (H) 12/17/2015   CHOL 175 12/17/2015   TRIG 176.0 (H) 12/17/2015   HDL 47.50 12/17/2015   LDLDIRECT 109.5 01/23/2007   LDLCALC 93 12/17/2015   ALT 15 12/17/2015   AST 20 12/17/2015   NA 139 12/17/2015   K 4.1 12/17/2015   CL 104 12/17/2015   CREATININE 0.72 12/17/2015   BUN 9 12/17/2015   CO2 27 12/17/2015   TSH 1.11 12/17/2015   HGBA1C 5.9 12/18/2015   MICROALBUR 0.3 10/30/2010       Assessment & Plan:

## 2016-05-26 NOTE — Patient Instructions (Addendum)
OK to stop the klonopin  You will be contacted regarding the referral for: counseling  Please continue all other medications as before, and refills have been done if requested.  Please have the pharmacy call with any other refills you may need.  Please keep your appointments with your specialists as you may have planned  Please go to the LAB in the Basement (turn left off the elevator) for the tests to be done today  You will be contacted by phone if any changes need to be made immediately.  Otherwise, you will receive a letter about your results with an explanation, but please check with MyChart first.  Please remember to sign up for MyChart if you have not done so, as this will be important to you in the future with finding out test results, communicating by private email, and scheduling acute appointments online when needed.

## 2016-05-27 LAB — URINALYSIS, ROUTINE W REFLEX MICROSCOPIC
BILIRUBIN URINE: NEGATIVE
Hgb urine dipstick: NEGATIVE
KETONES UR: NEGATIVE
Leukocytes, UA: NEGATIVE
Nitrite: NEGATIVE
PH: 7 (ref 5.0–8.0)
RBC / HPF: NONE SEEN (ref 0–?)
SPECIFIC GRAVITY, URINE: 1.01 (ref 1.000–1.030)
TOTAL PROTEIN, URINE-UPE24: NEGATIVE
UROBILINOGEN UA: 0.2 (ref 0.0–1.0)
Urine Glucose: NEGATIVE
WBC UA: NONE SEEN (ref 0–?)

## 2016-05-27 LAB — HEMOGLOBIN A1C: Hgb A1c MFr Bld: 5.6 % (ref 4.6–6.5)

## 2016-05-27 NOTE — Assessment & Plan Note (Signed)
stable overall by history and exam, recent data reviewed with pt, and pt to continue medical treatment as before,  to f/u any worsening symptoms or concerns Lab Results  Component Value Date   HGBA1C 5.9 12/18/2015

## 2016-05-27 NOTE — Assessment & Plan Note (Signed)
Unclear significance, for urine studies,  to f/u any worsening symptoms or concerns

## 2016-05-27 NOTE — Assessment & Plan Note (Signed)
With worsening recently due to family stress (not her marriage but another relative); ok for referral counseling per pt request, and remain off benzo at this time

## 2016-05-27 NOTE — Assessment & Plan Note (Signed)
Mild elevated today likely reactive emotional today, ok to continue to monitor at home and next visit

## 2016-06-21 ENCOUNTER — Ambulatory Visit (INDEPENDENT_AMBULATORY_CARE_PROVIDER_SITE_OTHER): Payer: PRIVATE HEALTH INSURANCE | Admitting: Clinical

## 2016-06-21 DIAGNOSIS — F419 Anxiety disorder, unspecified: Secondary | ICD-10-CM

## 2016-06-28 ENCOUNTER — Ambulatory Visit (INDEPENDENT_AMBULATORY_CARE_PROVIDER_SITE_OTHER): Payer: PRIVATE HEALTH INSURANCE | Admitting: Clinical

## 2016-06-28 DIAGNOSIS — F419 Anxiety disorder, unspecified: Secondary | ICD-10-CM | POA: Diagnosis not present

## 2016-07-06 ENCOUNTER — Ambulatory Visit: Payer: PRIVATE HEALTH INSURANCE | Admitting: Clinical

## 2016-08-03 ENCOUNTER — Ambulatory Visit (INDEPENDENT_AMBULATORY_CARE_PROVIDER_SITE_OTHER): Payer: PRIVATE HEALTH INSURANCE | Admitting: Clinical

## 2016-08-03 DIAGNOSIS — F419 Anxiety disorder, unspecified: Secondary | ICD-10-CM

## 2016-08-16 ENCOUNTER — Ambulatory Visit (INDEPENDENT_AMBULATORY_CARE_PROVIDER_SITE_OTHER): Payer: PRIVATE HEALTH INSURANCE | Admitting: Clinical

## 2016-08-16 DIAGNOSIS — F419 Anxiety disorder, unspecified: Secondary | ICD-10-CM | POA: Diagnosis not present

## 2016-08-24 ENCOUNTER — Ambulatory Visit (INDEPENDENT_AMBULATORY_CARE_PROVIDER_SITE_OTHER): Payer: PRIVATE HEALTH INSURANCE | Admitting: Clinical

## 2016-08-24 DIAGNOSIS — F419 Anxiety disorder, unspecified: Secondary | ICD-10-CM

## 2016-09-07 ENCOUNTER — Ambulatory Visit (INDEPENDENT_AMBULATORY_CARE_PROVIDER_SITE_OTHER): Payer: PRIVATE HEALTH INSURANCE | Admitting: Clinical

## 2016-09-07 DIAGNOSIS — F419 Anxiety disorder, unspecified: Secondary | ICD-10-CM

## 2016-09-23 ENCOUNTER — Encounter: Payer: Self-pay | Admitting: Internal Medicine

## 2016-09-23 DIAGNOSIS — G4710 Hypersomnia, unspecified: Secondary | ICD-10-CM

## 2016-09-24 NOTE — Addendum Note (Signed)
Addended by: Biagio Borg on: 09/24/2016 02:19 PM   Modules accepted: Orders

## 2016-10-07 ENCOUNTER — Ambulatory Visit (INDEPENDENT_AMBULATORY_CARE_PROVIDER_SITE_OTHER): Payer: PRIVATE HEALTH INSURANCE | Admitting: Clinical

## 2016-10-07 DIAGNOSIS — F419 Anxiety disorder, unspecified: Secondary | ICD-10-CM | POA: Diagnosis not present

## 2016-11-08 ENCOUNTER — Ambulatory Visit (INDEPENDENT_AMBULATORY_CARE_PROVIDER_SITE_OTHER): Payer: PRIVATE HEALTH INSURANCE | Admitting: Clinical

## 2016-11-08 DIAGNOSIS — F419 Anxiety disorder, unspecified: Secondary | ICD-10-CM | POA: Diagnosis not present

## 2016-12-07 ENCOUNTER — Ambulatory Visit: Payer: PRIVATE HEALTH INSURANCE | Admitting: Pulmonary Disease

## 2016-12-07 ENCOUNTER — Encounter: Payer: Self-pay | Admitting: Pulmonary Disease

## 2016-12-07 DIAGNOSIS — G4710 Hypersomnia, unspecified: Secondary | ICD-10-CM | POA: Insufficient documentation

## 2016-12-07 DIAGNOSIS — G4733 Obstructive sleep apnea (adult) (pediatric): Secondary | ICD-10-CM | POA: Diagnosis not present

## 2016-12-07 NOTE — Assessment & Plan Note (Signed)
Given excessive daytime somnolence, narrow pharyngeal exam, witnessed apneas & loud snoring, obstructive sleep apnea is very likely & an overnight polysomnogram will be scheduled as a home study. The pathophysiology of obstructive sleep apnea , it's cardiovascular consequences & modes of treatment including CPAP were discused with the patient in detail & they evidenced understanding.  Pretest probability is intermediate.  Her symptoms of restless legs are not typical.  Sleep quantity seems to be adequate.  If home study negative would consider attended polysomnogram to study her legs and quality of sleep followed by [facility]

## 2016-12-07 NOTE — Patient Instructions (Signed)
Home sleep study 

## 2016-12-07 NOTE — Progress Notes (Signed)
Subjective:    Patient ID: Lori Harmon, female    DOB: 1970-05-03, 46 y.o.   MRN: [id number]  HPI   Chief Complaint  Patient presents with  . Sleep Consult    Possible OSA. Per her husband, she stops breathing at night. Denies ever having a sleep study before.     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 She reports non-refreshing sleep feeling of tiredness over the primary for many years.  She has also been told about snoring for many years.  Her dad had OSA and was on a CPAP machine and died of an MI a few years ago.  She is worried about cardiovascular effects of sleep apnea and would like to get this checked out.  Her husband has noted snoring and witnessed apneas.  Epworth sleepiness score is 15 and she reports sleepiness in various situations such as lying down to rest in the afternoons, as a passenger in a car, sitting and reading and sitting inactive in a public place.  Bedtime is between 10 and 10:30 PM, sleep latency can be 30 minutes to an hour.  She does admit to using her Kindle at bedtime she cannot sleep.  She also reports a sensation to keep moving her legs when she gets into bed.  Her legs bother her towards evenings or when she is out of bed.  She sleeps on her stomach with one pillow in rolls over on her side later on, reports 1-2 nocturnal awakenings including nocturia and is out of bed by 6:45 AM feeling tired with occasional headaches and dryness of mouth.  On weekends he will nap for 2-3 hours and also stay in bed until 9 AM but still feels tired. She has gained 5-10 pounds over the last 2 years. She also reports of the foot but has noted light sleep and only reports deep sleep for about 60 minutes every night.  There is no history suggestive of cataplexy, sleep paralysis or parasomnias       Past Medical History:  Diagnosis Date  . ALLERGIC RHINITIS 01/27/2007  . ANXIETY 01/27/2007  .  DEPRESSION 01/27/2007  . DIABETES MELLITUS, TYPE II 01/27/2007  . HYPERLIPIDEMIA 01/27/2007  . HYPERTENSION 01/27/2007  . Palpitations   . PCOS (polycystic ovarian syndrome)   . SINUSITIS- ACUTE-NOS 01/30/2008  . 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 No pertinent surgical history.  Allergies  Allergen Reactions  . Klonopin [Clonazepam]     fatigue     Social History   Socioeconomic History  . Marital status: Married    Spouse name: Not on file  . Number of children: 0  . Years of education: Not on file  . Highest education level: Not on file  Social Needs  . Financial resource strain: Not on file  . Food insecurity - worry: Not on file  . Food insecurity - inability: Not on file  . Transportation needs - medical: Not on file  . Transportation needs - non-medical: Not on file  Occupational History  . Occupation: Designer, fashion/clothing: [employer]  Tobacco Use  . Smoking status: Never Smoker  . Smokeless tobacco: Never Used  Substance and Sexual Activity  . Alcohol use: Yes  . Drug use: No  . Sexual activity: Not on file  Other Topics Concern  . Not on file  Social History Narrative  . Not on file      Family History  Problem  Relation Age of Onset  . Diabetes Mother   . Cancer Father        Skin Cancers  . Heart attack Father   . Diabetes Other        Grandparents  . Hypertension Other        FH of  . Cancer Other        Aunt-Breast Cancer  . Heart attack Other        Aunt-MI in her 50's  . Alcohol abuse Other        Uncle  . Alcohol abuse Other        Grandmother  . Cancer Other        Grandmother-Lung Cancer  . Stroke Other     Review of Systems  Constitutional: Negative for fever and unexpected weight change.  HENT: Positive for congestion, postnasal drip, sneezing and sore throat. Negative for dental problem, ear pain, nosebleeds, rhinorrhea, sinus pressure and trouble swallowing.   Eyes: Negative for redness and itching.    Respiratory: Negative for cough, chest tightness and shortness of breath.   Cardiovascular: Negative for palpitations and leg swelling.  Gastrointestinal: Negative for nausea and vomiting.  Genitourinary: Negative for dysuria.  Musculoskeletal: Negative for joint swelling.  Skin: Negative for rash.  Allergic/Immunologic: Negative.  Negative for environmental allergies, food allergies and immunocompromised state.  Neurological: Negative for headaches.  Hematological: Does not bruise/bleed easily.  Psychiatric/Behavioral: Positive for dysphoric mood. The patient is not nervous/anxious.        Objective:   Physical Exam  Gen. Pleasant, well-nourished, in no distress, normal affect ENT -short uvula, no post nasal drip Neck: No JVD, no thyromegaly, no carotid bruits Lungs: no use of accessory muscles, no dullness to percussion, clear without rales or rhonchi  Cardiovascular: Rhythm regular, heart sounds  normal, no murmurs or gallops, no peripheral edema Abdomen: soft and non-tender, no hepatosplenomegaly, BS normal. Musculoskeletal: No deformities, no cyanosis or clubbing Neuro:  alert, non focal        Assessment & Plan:

## 2017-01-24 ENCOUNTER — Encounter: Payer: Self-pay | Admitting: Pulmonary Disease

## 2017-01-24 ENCOUNTER — Other Ambulatory Visit: Payer: Self-pay | Admitting: Internal Medicine

## 2017-01-24 ENCOUNTER — Other Ambulatory Visit: Payer: Self-pay

## 2017-01-24 DIAGNOSIS — G4733 Obstructive sleep apnea (adult) (pediatric): Secondary | ICD-10-CM

## 2017-01-24 MED ORDER — ESCITALOPRAM OXALATE 10 MG PO TABS: 10.0000 mg | ORAL_TABLET | Freq: Every day | ORAL | 3 refills | Status: DC

## 2017-01-24 NOTE — Addendum Note (Signed)
Addended by: Biagio Borg on: 01/24/2017 06:56 PM   Modules accepted: Orders

## 2017-01-24 NOTE — Telephone Encounter (Signed)
Done erx 

## 2017-01-24 NOTE — Telephone Encounter (Signed)
Pt would like a refill of her escitalopram (LEXAPRO) 10 MG tablet   to get her through to her CPE on 2-4, please advise

## 2017-01-25 ENCOUNTER — Telehealth: Payer: Self-pay | Admitting: Pulmonary Disease

## 2017-01-25 NOTE — Telephone Encounter (Signed)
I have contacted her ins and no precert is required for her home sleep study Joellen Jersey

## 2017-01-25 NOTE — Telephone Encounter (Signed)
Patient's insurance :  [facility] [place] [address]  Pre-certification # [id number]  Member# [id number] Member ID - [employer] #[id number]  This is under her husband's plan: Subscriber ID - Enid Cutter Subscriber member # [id number] (she believes)

## 2017-02-09 ENCOUNTER — Encounter: Payer: Self-pay | Admitting: Internal Medicine

## 2017-02-09 ENCOUNTER — Ambulatory Visit (INDEPENDENT_AMBULATORY_CARE_PROVIDER_SITE_OTHER): Payer: BLUE CROSS/BLUE SHIELD | Admitting: Internal Medicine

## 2017-02-09 ENCOUNTER — Other Ambulatory Visit (INDEPENDENT_AMBULATORY_CARE_PROVIDER_SITE_OTHER): Payer: BLUE CROSS/BLUE SHIELD

## 2017-02-09 VITALS — BP 136/84 | HR 84 | Temp 98.2°F | Ht 60.0 in | Wt 124.0 lb

## 2017-02-09 DIAGNOSIS — R7302 Impaired glucose tolerance (oral): Secondary | ICD-10-CM | POA: Diagnosis not present

## 2017-02-09 DIAGNOSIS — Z0000 Encounter for general adult medical examination without abnormal findings: Secondary | ICD-10-CM

## 2017-02-09 DIAGNOSIS — Z114 Encounter for screening for human immunodeficiency virus [HIV]: Secondary | ICD-10-CM | POA: Diagnosis not present

## 2017-02-09 LAB — URINALYSIS, ROUTINE W REFLEX MICROSCOPIC
BILIRUBIN URINE: NEGATIVE
Hgb urine dipstick: NEGATIVE
KETONES UR: NEGATIVE
Leukocytes, UA: NEGATIVE
NITRITE: NEGATIVE
PH: 7 (ref 5.0–8.0)
RBC / HPF: NONE SEEN (ref 0–?)
SPECIFIC GRAVITY, URINE: 1.015 (ref 1.000–1.030)
Total Protein, Urine: NEGATIVE
UROBILINOGEN UA: 0.2 (ref 0.0–1.0)
Urine Glucose: NEGATIVE
WBC UA: NONE SEEN (ref 0–?)

## 2017-02-09 LAB — HEPATIC FUNCTION PANEL
ALK PHOS: 57 U/L (ref 39–117)
ALT: 10 U/L (ref 0–35)
AST: 13 U/L (ref 0–37)
Albumin: 4.1 g/dL (ref 3.5–5.2)
BILIRUBIN DIRECT: 0.1 mg/dL (ref 0.0–0.3)
BILIRUBIN TOTAL: 0.3 mg/dL (ref 0.2–1.2)
Total Protein: 7.3 g/dL (ref 6.0–8.3)

## 2017-02-09 LAB — CBC WITH DIFFERENTIAL/PLATELET
BASOS ABS: 0 10*3/uL (ref 0.0–0.1)
BASOS PCT: 0.4 % (ref 0.0–3.0)
EOS ABS: 0.2 10*3/uL (ref 0.0–0.7)
Eosinophils Relative: 3 % (ref 0.0–5.0)
HCT: 40.3 % (ref 36.0–46.0)
Hemoglobin: 13.8 g/dL (ref 12.0–15.0)
LYMPHS ABS: 2.2 10*3/uL (ref 0.7–4.0)
LYMPHS PCT: 29.9 % (ref 12.0–46.0)
MCHC: 34.3 g/dL (ref 30.0–36.0)
MCV: 91.7 fl (ref 78.0–100.0)
Monocytes Absolute: 0.8 10*3/uL (ref 0.1–1.0)
Monocytes Relative: 11.7 % (ref 3.0–12.0)
NEUTROS ABS: 4 10*3/uL (ref 1.4–7.7)
Neutrophils Relative %: 55 % (ref 43.0–77.0)
PLATELETS: 289 10*3/uL (ref 150.0–400.0)
RBC: 4.39 Mil/uL (ref 3.87–5.11)
RDW: 12.8 % (ref 11.5–15.5)
WBC: 7.3 10*3/uL (ref 4.0–10.5)

## 2017-02-09 LAB — BASIC METABOLIC PANEL
BUN: 12 mg/dL (ref 6–23)
CHLORIDE: 105 meq/L (ref 96–112)
CO2: 27 meq/L (ref 19–32)
CREATININE: 0.72 mg/dL (ref 0.40–1.20)
Calcium: 9.9 mg/dL (ref 8.4–10.5)
GFR: 92.33 mL/min (ref >60.00)
Glucose, Bld: 124 mg/dL — ABNORMAL HIGH (ref 70–99)
POTASSIUM: 3.6 meq/L (ref 3.5–5.1)
Sodium: 139 mEq/L (ref 135–145)

## 2017-02-09 LAB — HEMOGLOBIN A1C: Hgb A1c MFr Bld: 5.9 % (ref 4.6–6.5)

## 2017-02-09 LAB — LIPID PANEL
CHOL/HDL RATIO: 4
CHOLESTEROL: 169 mg/dL (ref 0–200)
HDL: 45 mg/dL (ref >39.00)
NONHDL: 124.48
Triglycerides: 206 mg/dL — ABNORMAL HIGH (ref 0.0–149.0)
VLDL: 41.2 mg/dL — AB (ref 0.0–40.0)

## 2017-02-09 LAB — LDL CHOLESTEROL, DIRECT: LDL DIRECT: 106 mg/dL

## 2017-02-09 LAB — TSH: TSH: 2.12 u[IU]/mL (ref 0.35–4.50)

## 2017-02-09 NOTE — Patient Instructions (Signed)

## 2017-02-09 NOTE — Progress Notes (Signed)
Subjective:    Patient ID: Lori Harmon, female    DOB: [birth year], 47 y.o.   MRN: [id number]  HPI  Here for wellness and f/u;  Overall doing ok;  Pt denies Chest pain, worsening SOB, DOE, wheezing, orthopnea, PND, worsening LE edema, palpitations, dizziness or syncope.  Pt denies neurological change such as new headache, facial or extremity weakness.  Pt denies polydipsia, polyuria, or low sugar symptoms. Pt states overall good compliance with treatment and medications, good tolerability, and has been trying to follow appropriate diet.  Pt denies worsening depressive symptoms, suicidal ideation or panic. No fever, night sweats, wt loss, loss of appetite, or other constitutional symptoms.  Pt states good ability with ADL's, has low fall risk, home safety reviewed and adequate, no other significant changes in hearing or vision, and only occasionally active with exercise. Wt Readings from Last 3 Encounters:  02/09/17 124 lb (56.2 kg)  12/07/16 124 lb 6.4 oz (56.4 kg)  05/26/16 121 lb (54.9 kg)  Did well with behavioral health tx and son moving out with his drugs and dogs recently.  No other new complaints or interval hx Past Medical History:  Diagnosis Date  . ALLERGIC RHINITIS 01/27/2007  . ANXIETY 01/27/2007  . DEPRESSION 01/27/2007  . DIABETES MELLITUS, TYPE II 01/27/2007  . HYPERLIPIDEMIA 01/27/2007  . HYPERTENSION 01/27/2007  . Palpitations   . PCOS (polycystic ovarian syndrome)   . SINUSITIS- ACUTE-NOS 01/30/2008  . 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 No pertinent surgical history.  reports that  has never smoked. she has never used smokeless tobacco. She reports that she drinks alcohol. She reports that she does not use drugs. family history includes Alcohol abuse in her other and other; Cancer in her father, other, and other; Diabetes in her mother and other; Heart attack in her father and other; Hypertension in her other; Stroke in her other. Allergies  Allergen  Reactions  . Klonopin [Clonazepam]     fatigue   Current Outpatient Medications on File Prior to Visit  Medication Sig Dispense Refill  . escitalopram (LEXAPRO) 10 MG tablet Take 1 tablet (10 mg total) by mouth daily. 90 tablet 3  . Norgestim-Eth Estrad Triphasic (ORTHO TRI-CYCLEN, 28, PO) Take 1 tablet by mouth daily.     No current facility-administered medications on file prior to visit.    Review of Systems Constitutional: Negative for other unusual diaphoresis, sweats, appetite or weight changes HENT: Negative for other worsening hearing loss, ear pain, facial swelling, mouth sores or neck stiffness.   Eyes: Negative for other worsening pain, redness or other visual disturbance.  Respiratory: Negative for other stridor or swelling Cardiovascular: Negative for other palpitations or other chest pain  Gastrointestinal: Negative for worsening diarrhea or loose stools, blood in stool, distention or other pain Genitourinary: Negative for hematuria, flank pain or other change in urine volume.  Musculoskeletal: Negative for myalgias or other joint swelling.  Skin: Negative for other color change, or other wound or worsening drainage.  Neurological: Negative for other syncope or numbness. Hematological: Negative for other adenopathy or swelling Psychiatric/Behavioral: Negative for hallucinations, other worsening agitation, SI, self-injury, or new decreased concentration All other system neg per pt    Objective:   Physical Exam BP 136/84 (BP Location: Left Arm, Patient Position: Sitting, Cuff Size: Normal)   Pulse 84   Temp 98.2 F (36.8 C) (Oral)   Ht 5' (1.524 m)   Wt 124 lb (56.2 kg)   SpO2 99%   BMI  24.22 kg/m  VS noted,  Constitutional: Pt is oriented to person, place, and time. Appears well-developed and well-nourished, in no significant distress and comfortable Head: Normocephalic and atraumatic  Eyes: Conjunctivae and EOM are normal. Pupils are equal, round, and reactive to  light Right Ear: External ear normal without discharge Left Ear: External ear normal without discharge Nose: Nose without discharge or deformity Mouth/Throat: Oropharynx is without other ulcerations and moist  Neck: Normal range of motion. Neck supple. No JVD present. No tracheal deviation present or significant neck LA or mass Cardiovascular: Normal rate, regular rhythm, normal heart sounds and intact distal pulses.   Pulmonary/Chest: WOB normal and breath sounds without rales or wheezing  Abdominal: Soft. Bowel sounds are normal. NT. No HSM  Musculoskeletal: Normal range of motion. Exhibits no edema Lymphadenopathy: Has no other cervical adenopathy.  Neurological: Pt is alert and oriented to person, place, and time. Pt has normal reflexes. No cranial nerve deficit. Motor grossly intact, Gait intact Skin: Skin is warm and dry. No rash noted or new ulcerations Psychiatric:  Has normal mood and affect. Behavior is normal without agitation No other exam findings    Assessment & Plan:

## 2017-02-10 ENCOUNTER — Encounter: Payer: Self-pay | Admitting: Internal Medicine

## 2017-02-10 DIAGNOSIS — G4733 Obstructive sleep apnea (adult) (pediatric): Secondary | ICD-10-CM | POA: Diagnosis not present

## 2017-02-10 LAB — HIV ANTIBODY (ROUTINE TESTING W REFLEX): HIV 1&2 Ab, 4th Generation: NONREACTIVE

## 2017-02-10 NOTE — Assessment & Plan Note (Signed)

## 2017-02-10 NOTE — Assessment & Plan Note (Signed)
stable overall by history and exam, recent data reviewed with pt, and pt to continue medical treatment as before,  to f/u any worsening symptoms or concerns Lab Results  Component Value Date   HGBA1C 5.9 02/09/2017

## 2017-02-16 ENCOUNTER — Telehealth: Payer: Self-pay | Admitting: Pulmonary Disease

## 2017-02-16 DIAGNOSIS — G4733 Obstructive sleep apnea (adult) (pediatric): Secondary | ICD-10-CM | POA: Diagnosis not present

## 2017-02-16 NOTE — Telephone Encounter (Signed)
Per RA, patient has mild OSA with 5-6 events per hour. Test does not explain sleepiness. Suggests Lori Harmon has an split night study followed by a MSLT to determine the cause of sleepiness.   Will order these tests once patient is aware.

## 2017-02-18 ENCOUNTER — Other Ambulatory Visit: Payer: Self-pay | Admitting: *Deleted

## 2017-02-18 DIAGNOSIS — G4733 Obstructive sleep apnea (adult) (pediatric): Secondary | ICD-10-CM

## 2017-02-18 NOTE — Telephone Encounter (Signed)
Spoke with Patient about Dr Bari Mantis recommendations about split night test followed by MSLT. Patient stated understanding and agreed to further testing.

## 2017-02-18 NOTE — Telephone Encounter (Signed)
Orders placed for spilt night test followed by MSLT.

## 2017-03-15 ENCOUNTER — Ambulatory Visit (HOSPITAL_BASED_OUTPATIENT_CLINIC_OR_DEPARTMENT_OTHER): Payer: BLUE CROSS/BLUE SHIELD | Attending: Pulmonary Disease | Admitting: Pulmonary Disease

## 2017-03-15 VITALS — Ht 60.0 in | Wt 125.0 lb

## 2017-03-15 DIAGNOSIS — G4719 Other hypersomnia: Secondary | ICD-10-CM | POA: Diagnosis not present

## 2017-03-15 DIAGNOSIS — Z79899 Other long term (current) drug therapy: Secondary | ICD-10-CM | POA: Diagnosis not present

## 2017-03-15 DIAGNOSIS — G4733 Obstructive sleep apnea (adult) (pediatric): Secondary | ICD-10-CM | POA: Insufficient documentation

## 2017-03-15 DIAGNOSIS — R400 Somnolence: Secondary | ICD-10-CM | POA: Diagnosis not present

## 2017-03-15 DIAGNOSIS — R0683 Snoring: Secondary | ICD-10-CM | POA: Diagnosis not present

## 2017-03-15 DIAGNOSIS — R5383 Other fatigue: Secondary | ICD-10-CM | POA: Diagnosis not present

## 2017-03-16 ENCOUNTER — Ambulatory Visit (HOSPITAL_BASED_OUTPATIENT_CLINIC_OR_DEPARTMENT_OTHER): Payer: BLUE CROSS/BLUE SHIELD | Attending: Pulmonary Disease | Admitting: Pulmonary Disease

## 2017-03-16 DIAGNOSIS — G4733 Obstructive sleep apnea (adult) (pediatric): Secondary | ICD-10-CM

## 2017-03-16 DIAGNOSIS — Z79899 Other long term (current) drug therapy: Secondary | ICD-10-CM | POA: Insufficient documentation

## 2017-03-16 DIAGNOSIS — R5383 Other fatigue: Secondary | ICD-10-CM | POA: Insufficient documentation

## 2017-03-17 ENCOUNTER — Telehealth: Payer: Self-pay | Admitting: Pulmonary Disease

## 2017-03-17 NOTE — Telephone Encounter (Signed)
Checking on this Lori Harmon  

## 2017-03-17 NOTE — Telephone Encounter (Signed)
After speaking to pt she is aware there was no precert req for the split night study and the mslt study.the home sleep study was considered a elective test ansd was not covered she is aware she will not be responsible for any charges that were aquired from the [facility]

## 2017-03-18 DIAGNOSIS — G4710 Hypersomnia, unspecified: Secondary | ICD-10-CM | POA: Diagnosis not present

## 2017-03-18 NOTE — Procedures (Signed)
Patient Name: Lori Harmon, Lori Harmon Date: 03/15/2017 Gender: Female D.O.B: [birth year] Age (years): 47 Referring Provider: Kara Mead MD, ABSM Height (inches): 60 Interpreting Physician: Kara Mead MD, ABSM Weight (lbs): 125 RPSGT: Carolin Coy BMI: 24 MRN: [id number] Neck Size: 13.00 <br> <br> CLINICAL INFORMATION Sleep Study Type: NPSG  Indication for sleep study: Excessive Daytime Sleepiness, Fatigue, OSA, Snoring  Epworth Sleepiness Score: 15    SLEEP STUDY TECHNIQUE As per the AASM Manual for the Scoring of Sleep and Associated Events v2.3 (April 2016) with a hypopnea requiring 4% desaturations.  The channels recorded and monitored were frontal, central and occipital EEG, electrooculogram (EOG), submentalis EMG (chin), nasal and oral airflow, thoracic and abdominal wall motion, anterior tibialis EMG, snore microphone, electrocardiogram, and pulse oximetry.  MEDICATIONS Medications self-administered by patient taken the night of the study : ESCITALOPRAM, TRI-SPRINTEC BIRTH CONTROL  SLEEP ARCHITECTURE The study was initiated at 10:11:51 PM and ended at 5:58:57 AM.  Sleep onset time was 53.4 minutes and the sleep efficiency was 55.8%%. The total sleep time was 260.7 minutes.  Stage REM latency was 317.0 minutes.  The patient spent 19.5%% of the night in stage N1 sleep, 78.4%% in stage N2 sleep, 0.0%% in stage N3 and 2.11% in REM.  Alpha intrusion was absent.  Supine sleep was 20.02%.  RESPIRATORY PARAMETERS The overall apnea/hypopnea index (AHI) was 0.7 per hour. There were 0 total apneas, including 0 obstructive, 0 central and 0 mixed apneas. There were 3 hypopneas and 24 RERAs.  The AHI during Stage REM sleep was 0.0 per hour.  AHI while supine was 0.0 per hour.  The mean oxygen saturation was 96.8%. The minimum SpO2 during sleep was 93.0%.  soft snoring was noted during this study.  CARDIAC DATA The 2 lead EKG demonstrated sinus rhythm. The mean heart  rate was 72.4 beats per minute. Other EKG findings include: None.   LEG MOVEMENT DATA The total PLMS were 0 with a resulting PLMS index of 0.0. Associated arousal with leg movement index was 1.2 .  IMPRESSIONS - No significant obstructive sleep apnea occurred during this study (AHI = 0.7/h). - No significant central sleep apnea occurred during this study (CAI = 0.0/h). - The patient had minimal or no oxygen desaturation during the study (Min O2 = 93.0%) - The patient snored with soft snoring volume. - No cardiac abnormalities were noted during this study. - Clinically significant periodic limb movements did not occur during sleep. No significant associated arousals.   DIAGNOSIS ???????       Excessive daytime somnolence - no etiology found on this study   RECOMMENDATIONS - Proceed with MSLT to further investigate hypersomnolence - Avoid alcohol, sedatives and other CNS depressants that may worsen sleep apnea and disrupt normal sleep architecture. - Sleep hygiene should be reviewed to assess factors that may improve sleep quality. - Weight management and regular exercise should be initiated or continued if appropriate.   Kara Mead MD Board Certified in [place]

## 2017-03-18 NOTE — Procedures (Signed)
Patient Name: Lori Harmon, Lori Harmon Date: 03/16/2017 Gender: Female D.O.B: 1970/02/02 Age (years): 21 Referring Provider: Kara Mead MD, ABSM Height (inches): 60 Interpreting Physician: Kara Mead MD, ABSM Weight (lbs): 125 RPSGT: Jacolyn Reedy BMI: 24 MRN: [id number] Neck Size: 13.00 <br> <br> CLINICAL INFORMATION Sleep Study Type: MSLT  The patient was referred to the sleep center for evaluation of daytime sleepiness. Previous night NPSG was normal  Epworth Sleepiness Score: 15  SLEEP STUDY TECHNIQUE A Multiple Sleep Latency Test was performed after an overnight polysomnogram according to the AASM scoring manual v2.3 (April 2016) and clinical guidelines. Five nap opportunities occurred over the course of the test which followed an overnight polysomnogram. The channels recorded and monitored were frontal, central, and occipital electroencephalography (EEG), right and left electrooculogram (EOG), chin electromyography (EMG), and electrocardiogram (EKG).  MEDICATIONS Medications taken by the patient : ESCITALOPRAM, TRI-SPRINTEC BIRTH CONTROL Medications administered by patient during sleep study : No sleep medicine administered.   IMPRESSIONS - Total number of naps attempted: 4.00 . Total number of naps with sleep attained: 4 . The Mean Sleep Latency was 13:17  minutes. There were 0 sleep-onset REM periods. - The patient does not appear to have significant sleepiness on this test, though this finding may not always correlate with the patient's clinical symptoms. - No sleep onset REMs were noted during this MSLT.  DIAGNOSIS - Normal study   RECOMMENDATIONS - Return for follow up to evaluate other causes of excessive daytime sleepiness.   Kara Mead MD Board Certified in [place]

## 2017-03-29 ENCOUNTER — Encounter: Payer: Self-pay | Admitting: Pulmonary Disease

## 2017-03-29 NOTE — Telephone Encounter (Signed)
Quite a few limb movements - isolated movememnts are of unclear significance No periodic LMs (series of  Movements ) which are generally treated So no Rx required for now Pl schedule OV to discuss

## 2017-04-07 ENCOUNTER — Encounter: Payer: Self-pay | Admitting: Pulmonary Disease

## 2017-04-07 ENCOUNTER — Ambulatory Visit: Payer: BLUE CROSS/BLUE SHIELD | Admitting: Pulmonary Disease

## 2017-04-07 VITALS — BP 120/72 | HR 79 | Ht 60.75 in | Wt 123.0 lb

## 2017-04-07 DIAGNOSIS — G4710 Hypersomnia, unspecified: Secondary | ICD-10-CM | POA: Diagnosis not present

## 2017-04-07 MED ORDER — ARMODAFINIL 150 MG PO TABS: 150.0000 mg | ORAL_TABLET | Freq: Every day | ORAL | 0 refills | Status: DC

## 2017-04-07 NOTE — Patient Instructions (Signed)
Trial of Nuvigil 150 mg daily #14 Check your blood pressure while on this. Report back to me in 2 weeks to decide whether we should continue this  Exercise regimen 30-60 minutes daily. Keep scheduled bedtime and wakeup time. Extent total sleep time to 8.5 hours daily if possible  Please keep a sleep diary and email me back.

## 2017-04-07 NOTE — Assessment & Plan Note (Signed)
Certainly does not seem to have obstructive sleep apnea or narcolepsy. Limb movements do not seem to be an issue here since no arousals and no periodic limb movements.  Idiopathic hypersomnolence seems less likely because sleep latency on M SLT seems within normal range but will still treat her as such  Trial of Nuvigil 150 mg daily #14 Check your blood pressure while on this. Report back to me in 2 weeks to decide whether we should continue this  Sleep hygiene measures emphasized Exercise regimen 30-60 minutes daily. Keep scheduled bedtime and wakeup time. Extent total sleep time to 8.5 hours daily if possible  Please keep a sleep diary and email me back.

## 2017-04-07 NOTE — Progress Notes (Signed)
   Subjective:    Patient ID: Lori Harmon, female    DOB: 05-Feb-1970, 47 y.o.   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  She does have difficulty falling asleep on a couple of nights during the week but her main complaint does seem to be excessive sleepiness and tiredness during the day. Her sleep time seems to be adequate with at least 8 hours  She has just started on an exercise regimen. Husband has not really complain of leg movements and these do not keep her up at night   Significant tests/ events reviewed  HST mild, AHI 6/h 03/2017 NPSG neg   MSLT >> sleep latency 13 mins, no SOREMs  Review of Systems Patient denies significant dyspnea,cough, hemoptysis,  chest pain, palpitations, pedal edema, orthopnea, paroxysmal nocturnal dyspnea, lightheadedness, nausea, vomiting, abdominal or  leg pains      Objective:   Physical Exam  Gen. Pleasant, well-nourished, in no distress ENT - no thrush, no post nasal drip Neck: No JVD, no thyromegaly, no carotid bruits Lungs: no use of accessory muscles, no dullness to percussion, clear without rales or rhonchi  Cardiovascular: Rhythm regular, heart sounds  normal, no murmurs or gallops, no peripheral edema Musculoskeletal: No deformities, no cyanosis or clubbing        Assessment & Plan:

## 2017-04-11 ENCOUNTER — Encounter: Payer: Self-pay | Admitting: Pulmonary Disease

## 2017-04-11 NOTE — Telephone Encounter (Signed)
Received form for CoverMyMeds for patient's armodafinil 150. Will work on [place] and let patient know the outcome today.

## 2017-04-12 ENCOUNTER — Telehealth: Payer: Self-pay | Admitting: Pulmonary Disease

## 2017-04-12 NOTE — Telephone Encounter (Signed)
PA started on patient's armodafinil 150mg . Key on [email or web address] is [id number].   Per CMM, PA could take up to 72 hours. Will check back later for a determination.

## 2017-04-13 NOTE — Telephone Encounter (Signed)
Try Provigil 100 mg daily

## 2017-04-13 NOTE — Telephone Encounter (Signed)
Called and spoke to the patient and she would like the Provigil to be called in to the [facility] on [address].  RA please advise as to how many tabs you would like ordered and any refills? Thanks!

## 2017-04-13 NOTE — Telephone Encounter (Signed)
Checked Cover My Meds. PA has been denied. Covered alternatives were not provided.  Dr. Elsworth Soho - please advise. Thanks.

## 2017-04-14 NOTE — Telephone Encounter (Signed)
Called in Rx for Provigil 100 mg PO Daily #15. Nothing further needed at this time.

## 2017-04-14 NOTE — Telephone Encounter (Signed)
15 tabs

## 2017-04-18 ENCOUNTER — Telehealth: Payer: Self-pay

## 2017-04-18 NOTE — Telephone Encounter (Signed)
I imitated PA on CMM and we have to wait for a key to come   I called number below and initiated nPA for Modanafil 100mg . I gave clinical information and they will let us know a determination within 72 hours. Will route to CP to follow up.    Shelva Majestic Key: [id number] - PA Case ID: [id number] - Rx #: [id number]   Status  Additional Information Required   Drug Modafinil 100MG  OR TABS Form [facility] Electronic Prior Authorization Form   Original Claim Info 75 Call [phone] for PA Drug Requires Prior Authorization

## 2017-04-19 NOTE — Telephone Encounter (Signed)
PA has already been denied. Patient has switched over to armodafinil 150mg  per the last OV note. Will close this encounter as this PA is not needed.

## 2017-05-12 ENCOUNTER — Telehealth: Payer: Self-pay | Admitting: Pulmonary Disease

## 2017-05-12 NOTE — Telephone Encounter (Signed)
She states she has been taking melatonin and caffeine in the afternoon and is some better but still would like something else.  [facility] [address].

## 2017-05-12 NOTE — Telephone Encounter (Signed)
ATC pt, no answer. Left message for pt to call back.  

## 2017-05-13 MED ORDER — MODAFINIL 200 MG PO TABS: 200.0000 mg | ORAL_TABLET | Freq: Every day | ORAL | 0 refills | Status: DC

## 2017-05-13 NOTE — Telephone Encounter (Signed)
Patient returned call.  She states insurance told her they had no other medication to give her that is approved on their list.  She was given a number for [place] to call for PA [phone]. Patient's call back # [phone]

## 2017-05-13 NOTE — Telephone Encounter (Signed)
Patient returned call, CB [phone]

## 2017-05-13 NOTE — Telephone Encounter (Signed)
Spoke with pt and advised rx sent to pharmacy. Nothing further is needed.   Faxed to [facility].

## 2017-05-13 NOTE — Telephone Encounter (Signed)
Called and spoke to pt. Pt states she has not heard anything regarding her provigil rx (see phone note from 4.11.2019). [facility] on [address] and was advised this is also needing a PA. The pharmacy stated they will fax the rejection to the front fax. Called and spoke to pt. Pt states she is going to call insurance to see what the covered alternatives are and if she is able to afford them, she will then call us back with the options.   Will await her return call.

## 2017-05-13 NOTE — Telephone Encounter (Signed)
Provigil 200 mg at 8 AM x 30 tabs

## 2017-05-13 NOTE — Telephone Encounter (Signed)
Attempted to call pt to let her know we were changing her script from the armodafinil to provigil but unable to reach pt.   Left message for pt to return call.  Have pended script until we have spoken to pt.

## 2017-05-13 NOTE — Telephone Encounter (Signed)
I called her insurance and Provigil doesn't need a PA. Dr. Elsworth Soho do you want to switch her to this medication? The  Armodafinil 150 MG tablet was denied and this may be be the only other option. Dr. Elsworth Soho please advise.

## 2017-05-18 ENCOUNTER — Telehealth: Payer: Self-pay | Admitting: Pulmonary Disease

## 2017-05-18 NOTE — Telephone Encounter (Signed)
Received a fax from [facility] stating that the patient needed a PA for her Provigil. PA has been started via [email or web address]. Key is [employer].   Will route to myself for follow up.

## 2017-06-03 NOTE — Telephone Encounter (Signed)
Checked Cover My Meds. The original PA was sent to the wrong plan. I called [facility] at [phone]. This medication has been denied. A fax will be sent over to Dr. Bari Mantis attention with this information.

## 2017-06-06 NOTE — Telephone Encounter (Signed)
Received fax from [place] for PA for Modafinil. Form has been filled out and faxed by to [place].

## 2017-06-08 NOTE — Telephone Encounter (Signed)
Pt is requesting update on PA for Modafinil. Pt Cb is [phone]

## 2017-06-09 NOTE — Telephone Encounter (Signed)
Left detailed message for patient stating that we have received her message and will ask RA for his recommendations.   RA, please advise if you want to switch the patient to another type of medication. Thanks!

## 2017-06-09 NOTE — Telephone Encounter (Signed)
  [facility] sent over a fax regarding Modanafil was denied on 04/19/2017 so the PA done was cancelled. I am going to try to do the PA for [name]   Key: [id number] - PA Case ID: [id number]  Need help? Call us at [phone]    Status  Additional Information Required   Drug Armodafinil 150MG  tablets Form [facility] Electronic Prior Authorization Form  .

## 2017-06-09 NOTE — Telephone Encounter (Signed)
Pt said that the Provigil is going to cost $249.00 for #14 pills  Pt would like to have something else prescribed to her. [facility] [place]   Pt # [phone]

## 2017-06-10 NOTE — Telephone Encounter (Signed)
Ritalin 10mg  daily #30

## 2017-06-10 NOTE — Telephone Encounter (Signed)
There was never a PA done on [name] from previous message. I did the PA for this on CMM so we can also wait to see if they cover this before prescribing Ritalin if pt wants to or we can see if Ritalin goes through without PA. FYI  Jannett Celestine (Key: [id number])    [facility] is reviewing your PA request. Typically an electronic response will be received within 72 hours. To check for an update later, open this request from your dashboard. You may close this dialog and return to your dashboard to perform other tasks.

## 2017-06-10 NOTE — Telephone Encounter (Signed)
Patient returned call.  She stated that she would be ok with the switch to Ritalin from Armodafinil, for insurance.    Will route to Cherina to follow up.

## 2017-06-10 NOTE — Telephone Encounter (Signed)
No need to go PA for armodafinil as it has already been denied.   Left message to see if patient wants to switch to Ritalin.

## 2017-06-14 MED ORDER — METHYLPHENIDATE HCL 10 MG PO TABS: 10.0000 mg | ORAL_TABLET | Freq: Every day | ORAL | 0 refills | Status: DC

## 2017-06-14 NOTE — Telephone Encounter (Signed)
RX has been printed. Spoke with patient, she wishes to have this RX mailed to her. Verified her address over the phone.   Nothing else needed at time of call.

## 2017-09-18 ENCOUNTER — Encounter: Payer: Self-pay | Admitting: Internal Medicine

## 2017-09-19 MED ORDER — HYDROCHLOROTHIAZIDE 25 MG PO TABS: 12.5000 mg | ORAL_TABLET | Freq: Every day | ORAL | 3 refills | Status: DC

## 2017-09-19 NOTE — Telephone Encounter (Signed)
Dr. Elsworth Soho- please advise on pt email:  Good afternoon,  I just messaged Dr. Jenny Reichmann because i've been monitoring my blood pressure and noticed that although my systolic number has been in the normal range, my diastolic number has been high (85-95). He is going to prescribe a water pill for me to help with it. I'm just wondering if it'll be okay for me to take the retalin you prescribed for me in combination with the water pill? I've only been taking half a pill (5mg ) on an "as needed" basis. Please let me know and thanks for your help!    Lori Harmon

## 2017-09-20 NOTE — Telephone Encounter (Signed)
Okay to take in combination with water pill

## 2018-01-03 ENCOUNTER — Telehealth: Payer: BLUE CROSS/BLUE SHIELD | Admitting: Physician Assistant

## 2018-01-03 DIAGNOSIS — J00 Acute nasopharyngitis [common cold]: Secondary | ICD-10-CM

## 2018-01-03 MED ORDER — IPRATROPIUM BROMIDE 0.06 % NA SOLN: 2.0000 | Freq: Four times a day (QID) | NASAL | 0 refills | Status: AC

## 2018-01-03 NOTE — Progress Notes (Signed)
We are sorry you are not feeling well.  Here is how we plan to help!  Based on what you have shared with me, it looks like you may have a viral upper respiratory infection or a "common cold".  Colds are caused by a large number of viruses; however, rhinovirus is the most common cause.   Symptoms of the common cold vary from person to person, with common symptoms including sore throat, cough, and malaise.  A low-grade fever of 100.4 may present, but is often uncommon.  Symptoms vary however, and are closely related to a person's age or underlying illnesses.  The most common symptoms associated with the common cold are nasal discharge or congestion, cough, sneezing, headache and pressure in the ears and face.  Cold symptoms usually persist for about 3 to 10 days, but can last up to 2 weeks.  It is important to know that colds do not cause serious illness or complications in most cases.    The common cold is transmitted from person to person, with the most common method of transmission being a person's hands.  The virus is able to live on the skin and can infect other persons for up to 2 hours after direct contact.  Also, colds are transmitted when someone coughs or sneezes; thus, it is important to cover the mouth to reduce this risk.  To keep the spread of the common cold at [facility], good hand hygiene is very important.  This is an infection that is most likely caused by a virus. There are no specific treatments for the common cold other than to help you with the symptoms until the infection runs its course.    For nasal congestion, you may use an oral decongestants such as Mucinex D or if you have glaucoma or high blood pressure use plain Mucinex.  Saline nasal spray or nasal drops can help and can safely be used as often as needed for congestion.  For your congestion, I have prescribed Ipratropium Bromide nasal spray 0.03% two sprays in each nostril 2-3 times a day  If you do not have a history of heart  disease, hypertension, diabetes or thyroid disease, prostate/bladder issues or glaucoma, you may also use Sudafed to treat nasal congestion.  It is highly recommended that you consult with a pharmacist or your primary care physician to ensure this medication is safe for you to take.     If you have a cough, you may use cough suppressants such as Delsym and Robitussin.  If you have glaucoma or high blood pressure, you can also use Coricidin HBP.    If you have a sore or scratchy throat, use a saltwater gargle-  to  teaspoon of salt dissolved in a 4-ounce to 8-ounce glass of warm water.  Gargale the solution for approximately 15-30 seconds and then spit.  It is important not to swallow the solution.  You can also use throat lozenges/cough drops and Chloraseptic spray to help with throat pain or discomfort.  Warm or cold liquids can also be helpful in relieving throat pain.  For headache, pain or general discomfort, you can use Ibuprofen or Tylenol as directed.   Some authorities believe that zinc sprays or the use of Echinacea may shorten the course of your symptoms.   HOME CARE Only take medications as instructed by your medical team. Be sure to drink plenty of fluids. Water is fine as well as fruit juices, sodas and electrolyte beverages. You may want to stay  away from caffeine or alcohol. If you are nauseated, try taking small sips of liquids. How do you know if you are getting enough fluid? Your urine should be a pale yellow or almost colorless. Get rest. Taking a steamy shower or using a humidifier may help nasal congestion and ease sore throat pain. You can place a towel over your head and breathe in the steam from hot water coming from a faucet. Using a saline nasal spray works much the same way. Cough drops, hard candies and sore throat lozenges may ease your cough. Avoid close contacts especially the very young and the elderly Cover your mouth if you cough or sneeze Always remember to  wash your hands.   GET HELP RIGHT AWAY IF: You develop worsening fever. If your symptoms do not improve within 10 days You develop yellow or green discharge from your nose over 3 days. You have coughing fits You develop a severe head ache or visual changes. You develop shortness of breath, difficulty breathing or start having chest pain  Your symptoms persist after you have completed your treatment plan  MAKE SURE YOU  Understand these instructions. Will watch your condition. Will get help right away if you are not doing well or get worse.  Your e-visit answers were reviewed by a board certified advanced clinical practitioner to complete your personal care plan. Depending upon the condition, your plan could have included both over the counter or prescription medications. Please review your pharmacy choice. If there is a problem, you may call our nursing hot line at and have the prescription routed to another pharmacy. Your safety is important to [place]. If you have drug allergies check your prescription carefully.   You can use MyChart to ask questions about today's visit, request a non-urgent call back, or ask for a work or school excuse for 24 hours related to this e-Visit. If it has been greater than 24 hours you will need to follow up with your provider, or enter a new e-Visit to address those concerns. You will get an e-mail in the next two days asking about your experience.  I hope that your e-visit has been valuable and will speed your recovery. Thank you for using e-visits.      ===View-only below this line===   ----- Message -----    From: Rennis Chris    Sent: 01/03/2018  9:05 AM EST      To: E-Visit Mailing List Subject: E-Visit Submission: Flu Like Symptoms  E-Visit Submission: Flu Like Symptoms --------------------------------  Question: How long have you had flu like symptoms? Answer:   over two days  Question: Do you have a cough or sore throat? Answer:   I  have both a cough and a sore throat  Question: Are you in close contact with anyone who has similar symptoms ? Answer:   Yes  Question: Do you have a fever? Answer:   No, I do not have a fever  Question: Are you short of breath? Answer:   No  Question: Do you have constant pain in your chest or abdomen? Answer:   No  Question: Are you able to keep liquids down? Answer:   Yes  Question: Have you experienced a seizure or loss of consciousness? Answer:   No  Question: Do you have a headache? Answer:   No  Question: Is there a rash? Answer:   No  Question: Are you over the age of 60? Answer:   No  Question: Are  you treated for any of the following conditions: Asthma, COPD, diabetes, renal failure on dialysis, AIDS, any neuromuscular disease that effects the clearing of secretions heart failure or heart disease? Answer:   No  Question: Describe your sore throat: Answer:   I have been experiencing post nasal drip which I believe is the cause of my coughing and soar throat.  It has been off and on, more at night when laying down to sleep.  Question: How long have you had a sore throat? Answer:   4 days  Question: Do you have any tenderness or swelling in your neck? Answer:   No  Question: Have you received recent chemotherapy or are you taking a drug that may reduce your ability to fight infections for psoriasis, arthritis, or any other autoimmune condition? Answer:   No  Question: Do you have close contact with anyone who has been diagnosed with any of the conditions listed above? Answer:   No  Question: Are you pregnant? Answer:   I am confident that I am not pregnant  Question: Are you breastfeeding? Answer:   No  Question: Are your symptoms severe enough that you think or someone has told you that you need to see a physician urgently? Answer:   No  Question: Are you allergic to Zanamivir or Oseltamivir (Tamiflu)? Answer:   No  Question: Are there children in  your family or household that are under the age of 75? Answer:   No  Question: Please list your medication allergies that you may have ? (If 'none' , please list as 'none') Answer:   None  Question: Please list any additional comments  Answer:   I believe it's an upper respiratory infection since my nose has been stuffy off and on. My eye started to water today. My throat has been soar and I've been coughing. I have also been very tired. My mom just got over an upper respiratory infection, so I feel I caught it as well.

## 2018-01-25 ENCOUNTER — Other Ambulatory Visit: Payer: Self-pay | Admitting: Internal Medicine

## 2018-02-03 ENCOUNTER — Encounter: Payer: Self-pay | Admitting: Internal Medicine

## 2018-02-17 ENCOUNTER — Other Ambulatory Visit (INDEPENDENT_AMBULATORY_CARE_PROVIDER_SITE_OTHER): Payer: BLUE CROSS/BLUE SHIELD

## 2018-02-17 DIAGNOSIS — R7302 Impaired glucose tolerance (oral): Secondary | ICD-10-CM

## 2018-02-17 DIAGNOSIS — Z0000 Encounter for general adult medical examination without abnormal findings: Secondary | ICD-10-CM | POA: Diagnosis not present

## 2018-02-17 LAB — CBC WITH DIFFERENTIAL/PLATELET
BASOS PCT: 0.7 % (ref 0.0–3.0)
Basophils Absolute: 0 10*3/uL (ref 0.0–0.1)
EOS PCT: 5.4 % — AB (ref 0.0–5.0)
Eosinophils Absolute: 0.3 10*3/uL (ref 0.0–0.7)
HEMATOCRIT: 45.5 % (ref 36.0–46.0)
HEMOGLOBIN: 15.6 g/dL — AB (ref 12.0–15.0)
LYMPHS PCT: 43.7 % (ref 12.0–46.0)
Lymphs Abs: 2.5 10*3/uL (ref 0.7–4.0)
MCHC: 34.4 g/dL (ref 30.0–36.0)
MCV: 92.8 fl (ref 78.0–100.0)
Monocytes Absolute: 0.6 10*3/uL (ref 0.1–1.0)
Monocytes Relative: 11.4 % (ref 3.0–12.0)
NEUTROS ABS: 2.2 10*3/uL (ref 1.4–7.7)
Neutrophils Relative %: 38.8 % — ABNORMAL LOW (ref 43.0–77.0)
PLATELETS: 266 10*3/uL (ref 150.0–400.0)
RBC: 4.9 Mil/uL (ref 3.87–5.11)
RDW: 13 % (ref 11.5–15.5)
WBC: 5.7 10*3/uL (ref 4.0–10.5)

## 2018-02-17 LAB — HEPATIC FUNCTION PANEL
ALBUMIN: 4.6 g/dL (ref 3.5–5.2)
ALT: 26 U/L (ref 0–35)
AST: 24 U/L (ref 0–37)
Alkaline Phosphatase: 61 U/L (ref 39–117)
Bilirubin, Direct: 0.1 mg/dL (ref 0.0–0.3)
TOTAL PROTEIN: 7.6 g/dL (ref 6.0–8.3)
Total Bilirubin: 0.5 mg/dL (ref 0.2–1.2)

## 2018-02-17 LAB — URINALYSIS, ROUTINE W REFLEX MICROSCOPIC
Bilirubin Urine: NEGATIVE
Hgb urine dipstick: NEGATIVE
Ketones, ur: NEGATIVE
Leukocytes,Ua: NEGATIVE
Nitrite: POSITIVE — AB
SPECIFIC GRAVITY, URINE: 1.015 (ref 1.000–1.030)
TOTAL PROTEIN, URINE-UPE24: NEGATIVE
UROBILINOGEN UA: 0.2 (ref 0.0–1.0)
Urine Glucose: NEGATIVE
pH: 7 (ref 5.0–8.0)

## 2018-02-17 LAB — BASIC METABOLIC PANEL
BUN: 10 mg/dL (ref 6–23)
CO2: 27 mEq/L (ref 19–32)
Calcium: 9.7 mg/dL (ref 8.4–10.5)
Chloride: 100 mEq/L (ref 96–112)
Creatinine, Ser: 0.72 mg/dL (ref 0.40–1.20)
GFR: 86.49 mL/min (ref >60.00)
Glucose, Bld: 98 mg/dL (ref 70–99)
POTASSIUM: 3.5 meq/L (ref 3.5–5.1)
SODIUM: 139 meq/L (ref 135–145)

## 2018-02-17 LAB — TSH: TSH: 3.12 u[IU]/mL (ref 0.35–4.50)

## 2018-02-17 LAB — LIPID PANEL
CHOLESTEROL: 198 mg/dL (ref 0–200)
HDL: 52.7 mg/dL (ref >39.00)
LDL Cholesterol: 115 mg/dL — ABNORMAL HIGH (ref 0–99)
NonHDL: 144.88
TRIGLYCERIDES: 150 mg/dL — AB (ref 0.0–149.0)
Total CHOL/HDL Ratio: 4
VLDL: 30 mg/dL (ref 0.0–40.0)

## 2018-02-17 LAB — HEMOGLOBIN A1C: Hgb A1c MFr Bld: 5.5 % (ref 4.6–6.5)

## 2018-02-20 ENCOUNTER — Encounter: Payer: Self-pay | Admitting: Internal Medicine

## 2018-02-20 ENCOUNTER — Ambulatory Visit (INDEPENDENT_AMBULATORY_CARE_PROVIDER_SITE_OTHER): Payer: BLUE CROSS/BLUE SHIELD | Admitting: Internal Medicine

## 2018-02-20 VITALS — BP 120/64 | HR 95 | Temp 98.1°F | Ht 60.75 in | Wt 122.0 lb

## 2018-02-20 DIAGNOSIS — R7302 Impaired glucose tolerance (oral): Secondary | ICD-10-CM

## 2018-02-20 DIAGNOSIS — Z0000 Encounter for general adult medical examination without abnormal findings: Secondary | ICD-10-CM | POA: Diagnosis not present

## 2018-02-20 DIAGNOSIS — I10 Essential (primary) hypertension: Secondary | ICD-10-CM

## 2018-02-20 MED ORDER — AMLODIPINE BESYLATE 5 MG PO TABS: 5.0000 mg | ORAL_TABLET | Freq: Every day | ORAL | 3 refills | Status: DC

## 2018-02-20 MED ORDER — ESCITALOPRAM OXALATE 10 MG PO TABS: 10.0000 mg | ORAL_TABLET | Freq: Every day | ORAL | 3 refills | Status: DC

## 2018-02-20 NOTE — Progress Notes (Signed)
Subjective:    Patient ID: Lori Harmon, female    DOB: [birth year], 48 y.o.   MRN: [id number]  HPI  Here for wellness and f/u;  Overall doing ok;  Pt denies Chest pain, worsening SOB, DOE, wheezing, orthopnea, PND, worsening LE edema, palpitations, dizziness or syncope.  Pt denies neurological change such as new headache, facial or extremity weakness.  Pt denies polydipsia, polyuria, or low sugar symptoms. Pt states overall good compliance with treatment and medications, good tolerability, and has been trying to follow appropriate diet.  Pt denies worsening depressive symptoms, suicidal ideation or panic. No fever, night sweats, wt loss, loss of appetite, or other constitutional symptoms.  Pt states good ability with ADL's, has low fall risk, home safety reviewed and adequate, no other significant changes in hearing or vision, and only occasionally active with exercise.  Wt Readings from Last 3 Encounters:  02/20/18 122 lb (55.3 kg)  04/07/17 123 lb (55.8 kg)  03/16/17 125 lb (56.7 kg)  BP at home has been elevated > 140/90 over the last few month, also at GYN appt last yr when she also took her BP machine.   BP Readings from Last 3 Encounters:  02/20/18 120/64  04/07/17 120/72  02/09/17 136/84   Past Medical History:  Diagnosis Date  . ALLERGIC RHINITIS 01/27/2007  . ANXIETY 01/27/2007  . DEPRESSION 01/27/2007  . DIABETES MELLITUS, TYPE II 01/27/2007  . HYPERLIPIDEMIA 01/27/2007  . HYPERTENSION 01/27/2007  . Palpitations   . PCOS (polycystic ovarian syndrome)   . SINUSITIS- ACUTE-NOS 01/30/2008  . 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  reports that she has never smoked. She has never used smokeless tobacco. She reports current alcohol use. She reports that she does not use drugs. family history includes Alcohol abuse in some other family members; Cancer in her father and other family members; Diabetes in her mother and another family member; Heart attack in  her father and another family member; Hypertension in an other family member; Stroke in an other family member. Allergies  Allergen Reactions  . Klonopin [Clonazepam]     fatigue   Current Outpatient Medications on File Prior to Visit  Medication Sig Dispense Refill  . Armodafinil 150 MG tablet Take 1 tablet (150 mg total) by mouth daily. 14 tablet 0  . hydrochlorothiazide (HYDRODIURIL) 25 MG tablet Take 0.5 tablets (12.5 mg total) by mouth daily. 45 tablet 3  . ipratropium (ATROVENT) 0.06 % nasal spray Place 2 sprays into both nostrils 4 (four) times daily. 15 mL 0  . methylphenidate (RITALIN) 10 MG tablet Take 1 tablet (10 mg total) by mouth daily. 30 tablet 0  . modafinil (PROVIGIL) 200 MG tablet Take 1 tablet (200 mg total) by mouth daily. Take med at 8am daily 30 tablet 0  . Norgestim-Eth Estrad Triphasic (ORTHO TRI-CYCLEN, 28, PO) Take 1 tablet by mouth daily.     No current facility-administered medications on file prior to visit.    Review of Systems Constitutional: Negative for other unusual diaphoresis, sweats, appetite or weight changes HENT: Negative for other worsening hearing loss, ear pain, facial swelling, mouth sores or neck stiffness.   Eyes: Negative for other worsening pain, redness or other visual disturbance.  Respiratory: Negative for other stridor or swelling Cardiovascular: Negative for other palpitations or other chest pain  Gastrointestinal: Negative for worsening diarrhea or loose stools, blood in stool, distention or other pain Genitourinary: Negative for hematuria, flank pain or other change in  urine volume.  Musculoskeletal: Negative for myalgias or other joint swelling.  Skin: Negative for other color change, or other wound or worsening drainage.  Neurological: Negative for other syncope or numbness. Hematological: Negative for other adenopathy or swelling Psychiatric/Behavioral: Negative for hallucinations, other worsening agitation, SI, self-injury, or  new decreased concentration All other system neg per pt    Objective:   Physical Exam BP 120/64   Pulse 95   Temp 98.1 F (36.7 C) (Oral)   Ht 5' 0.75" (1.543 m)   Wt 122 lb (55.3 kg)   SpO2 98%   BMI 23.24 kg/m  VS noted,  Constitutional: Pt is oriented to person, place, and time. Appears well-developed and well-nourished, in no significant distress and comfortable Head: Normocephalic and atraumatic  Eyes: Conjunctivae and EOM are normal. Pupils are equal, round, and reactive to light Right Ear: External ear normal without discharge Left Ear: External ear normal without discharge Nose: Nose without discharge or deformity Mouth/Throat: Oropharynx is without other ulcerations and moist  Neck: Normal range of motion. Neck supple. No JVD present. No tracheal deviation present or significant neck LA or mass Cardiovascular: Normal rate, regular rhythm, normal heart sounds and intact distal pulses.   Pulmonary/Chest: WOB normal and breath sounds without rales or wheezing  Abdominal: Soft. Bowel sounds are normal. NT. No HSM  Musculoskeletal: Normal range of motion. Exhibits no edema Lymphadenopathy: Has no other cervical adenopathy.  Neurological: Pt is alert and oriented to person, place, and time. Pt has normal reflexes. No cranial nerve deficit. Motor grossly intact, Gait intact Skin: Skin is warm and dry. No rash noted or new ulcerations Psychiatric:  Has normal mood and affect. Behavior is normal without agitation No other exam findings Lab Results  Component Value Date   WBC 5.7 02/17/2018   HGB 15.6 (H) 02/17/2018   HCT 45.5 02/17/2018   PLT 266.0 02/17/2018   GLUCOSE 98 02/17/2018   CHOL 198 02/17/2018   TRIG 150.0 (H) 02/17/2018   HDL 52.70 02/17/2018   LDLDIRECT 106.0 02/09/2017   LDLCALC 115 (H) 02/17/2018   ALT 26 02/17/2018   AST 24 02/17/2018   NA 139 02/17/2018   K 3.5 02/17/2018   CL 100 02/17/2018   CREATININE 0.72 02/17/2018   BUN 10 02/17/2018   CO2 27  02/17/2018   TSH 3.12 02/17/2018   HGBA1C 5.5 02/17/2018   MICROALBUR 0.3 10/30/2010       Assessment & Plan:

## 2018-02-20 NOTE — Assessment & Plan Note (Signed)

## 2018-02-20 NOTE — Assessment & Plan Note (Signed)
stable overall by history and exam, recent data reviewed with pt, and pt to continue medical treatment as before,  to f/u any worsening symptoms or concerns  

## 2018-02-20 NOTE — Patient Instructions (Signed)
Please take all new medication as prescribed - the amlodipine 5 mg per day  Please call in 3-4 wks if BP not improved, to consider increased amlodipine to 10 mg  Please continue all other medications as before, and refills have been done if requested.  Please have the pharmacy call with any other refills you may need.  Please continue your efforts at being more active, low cholesterol diet, and weight control.  You are otherwise up to date with prevention measures today.  Please keep your appointments with your specialists as you may have planned  Please return in 6 months, or sooner if needed

## 2018-02-20 NOTE — Assessment & Plan Note (Signed)
With persistent elevated BP at home per pt, to add amlodipine 5 qd, cont to monitor, f/u 3-4 wks if not controlled

## 2018-04-02 ENCOUNTER — Encounter: Payer: Self-pay | Admitting: Internal Medicine

## 2018-04-03 MED ORDER — HYDROCHLOROTHIAZIDE 25 MG PO TABS: 12.5000 mg | ORAL_TABLET | Freq: Every day | ORAL | 3 refills | Status: DC

## 2018-08-21 ENCOUNTER — Other Ambulatory Visit (INDEPENDENT_AMBULATORY_CARE_PROVIDER_SITE_OTHER): Payer: BC Managed Care – PPO

## 2018-08-21 ENCOUNTER — Encounter: Payer: Self-pay | Admitting: Internal Medicine

## 2018-08-21 ENCOUNTER — Other Ambulatory Visit: Payer: Self-pay

## 2018-08-21 ENCOUNTER — Ambulatory Visit (INDEPENDENT_AMBULATORY_CARE_PROVIDER_SITE_OTHER): Payer: BC Managed Care – PPO | Admitting: Internal Medicine

## 2018-08-21 VITALS — BP 126/82 | HR 99 | Temp 98.1°F | Ht 60.75 in | Wt 120.0 lb

## 2018-08-21 DIAGNOSIS — I10 Essential (primary) hypertension: Secondary | ICD-10-CM | POA: Diagnosis not present

## 2018-08-21 DIAGNOSIS — E559 Vitamin D deficiency, unspecified: Secondary | ICD-10-CM

## 2018-08-21 DIAGNOSIS — E611 Iron deficiency: Secondary | ICD-10-CM | POA: Diagnosis not present

## 2018-08-21 DIAGNOSIS — M353 Polymyalgia rheumatica: Secondary | ICD-10-CM

## 2018-08-21 DIAGNOSIS — E538 Deficiency of other specified B group vitamins: Secondary | ICD-10-CM

## 2018-08-21 DIAGNOSIS — R7302 Impaired glucose tolerance (oral): Secondary | ICD-10-CM | POA: Diagnosis not present

## 2018-08-21 DIAGNOSIS — E785 Hyperlipidemia, unspecified: Secondary | ICD-10-CM

## 2018-08-21 LAB — CK: Total CK: 37 U/L (ref 7–177)

## 2018-08-21 LAB — SEDIMENTATION RATE: Sed Rate: 12 mm/hr (ref 0–20)

## 2018-08-21 LAB — IBC PANEL
Iron: 109 ug/dL (ref 42–145)
Saturation Ratios: 31.6 % (ref 20.0–50.0)
Transferrin: 246 mg/dL (ref 212.0–360.0)

## 2018-08-21 LAB — URIC ACID: Uric Acid, Serum: 5.2 mg/dL (ref 2.4–7.0)

## 2018-08-21 LAB — C-REACTIVE PROTEIN: CRP: <1 mg/dL (ref 0.5–20.0)

## 2018-08-21 LAB — VITAMIN B12: Vitamin B-12: 432 pg/mL (ref 211–911)

## 2018-08-21 LAB — HEMOGLOBIN A1C: Hgb A1c MFr Bld: 5.9 % (ref 4.6–6.5)

## 2018-08-21 LAB — VITAMIN D 25 HYDROXY (VIT D DEFICIENCY, FRACTURES): VITD: 30.62 ng/mL (ref 30.00–100.00)

## 2018-08-21 MED ORDER — LOSARTAN POTASSIUM 50 MG PO TABS: 50.0000 mg | ORAL_TABLET | Freq: Every day | ORAL | 3 refills | Status: DC

## 2018-08-21 NOTE — Assessment & Plan Note (Addendum)
stable overall by history and exam, recent data reviewed with pt, and pt to change amlodipine to losartan 50 qd,  to f/u any worsening symptoms or concerns

## 2018-08-21 NOTE — Progress Notes (Signed)
Subjective:    Patient ID: Lori Harmon, female    DOB: May 18, 1970, 48 y.o.   MRN: [id number]  HPI  Here to f/u; overall doing ok,  Pt denies chest pain, increasing sob or doe, wheezing, orthopnea, PND, increased LE swelling, palpitations, dizziness or syncope.  Pt denies new neurological symptoms such as new headache, or facial or extremity weakness or numbness.  Pt denies polydipsia, polyuria, or low sugar episode.  Pt states overall good compliance with meds, mostly trying to follow appropriate diet, with wt overall stable,  but little exercise however. BP even mild better at home than today, usually sbp 110, Pt denies chest pain, increased sob or doe, wheezing, orthopnea, PND, increased LE swelling, palpitations, dizziness or syncope.  Has had more constipation recently and wondering about the amlodipine.  Also, has been having mod to severe muscle pains, has been doing tae quan do classes and treadmill 2 times per wj about 4 miles each., but then noticed more shoulder and hip pains, thought first it was from increased exercise, but has been scaling back but still having the pain. Pain is moderate to occas severe, sort of a soreness.  Has looked online and saw constipation and muscle weakness, so would the amlodipine changed if possible.  Tylenol helps with the pain Past Medical History:  Diagnosis Date  . ALLERGIC RHINITIS 01/27/2007  . ANXIETY 01/27/2007  . DEPRESSION 01/27/2007  . DIABETES MELLITUS, TYPE II 01/27/2007  . HYPERLIPIDEMIA 01/27/2007  . HYPERTENSION 01/27/2007  . Palpitations   . PCOS (polycystic ovarian syndrome)   . SINUSITIS- ACUTE-NOS 01/30/2008  . 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  reports that she has never smoked. She has never used smokeless tobacco. She reports current alcohol use. She reports that she does not use drugs. family history includes Alcohol abuse in some other family members; Cancer in her father and other family members;  Diabetes in her mother and another family member; Heart attack in her father and another family member; Hypertension in an other family member; Stroke in an other family member. Allergies  Allergen Reactions  . Klonopin [Clonazepam]     fatigue   Current Outpatient Medications on File Prior to Visit  Medication Sig Dispense Refill  . Armodafinil 150 MG tablet Take 1 tablet (150 mg total) by mouth daily. 14 tablet 0  . escitalopram (LEXAPRO) 10 MG tablet Take 1 tablet (10 mg total) by mouth daily. 90 tablet 3  . hydrochlorothiazide (HYDRODIURIL) 25 MG tablet Take 0.5 tablets (12.5 mg total) by mouth daily. 45 tablet 3  . ipratropium (ATROVENT) 0.06 % nasal spray Place 2 sprays into both nostrils 4 (four) times daily. 15 mL 0  . modafinil (PROVIGIL) 200 MG tablet Take 1 tablet (200 mg total) by mouth daily. Take med at 8am daily 30 tablet 0  . Norgestim-Eth Estrad Triphasic (ORTHO TRI-CYCLEN, 28, PO) Take 1 tablet by mouth daily.    . methylphenidate (RITALIN) 10 MG tablet Take 1 tablet (10 mg total) by mouth daily. 30 tablet 0   No current facility-administered medications on file prior to visit.    Review of Systems Constitutional: Negative for other unusual diaphoresis, sweats, appetite or weight changes HENT: Negative for other worsening hearing loss, ear pain, facial swelling, mouth sores or neck stiffness.   Eyes: Negative for other worsening pain, redness or other visual disturbance.  Respiratory: Negative for other stridor or swelling Cardiovascular: Negative for other palpitations or other chest pain  Gastrointestinal: Negative for worsening diarrhea or loose stools, blood in stool, distention or other pain Genitourinary: Negative for hematuria, flank pain or other change in urine volume.  Musculoskeletal: Negative for myalgias or other joint swelling.  Skin: Negative for other color change, or other wound or worsening drainage.  Neurological: Negative for other syncope or  numbness. Hematological: Negative for other adenopathy or swelling Psychiatric/Behavioral: Negative for hallucinations, other worsening agitation, SI, self-injury, or new decreased concentration All other system neg per pt    Objective:   Physical Exam BP 126/82   Pulse 99   Temp 98.1 F (36.7 C) (Oral)   Ht 5' 0.75" (1.543 m)   Wt 120 lb (54.4 kg)   SpO2 98%   BMI 22.86 kg/m  VS noted,  Constitutional: Pt appears in NAD HENT: Head: NCAT.  Right Ear: External ear normal.  Left Ear: External ear normal.  Eyes: . Pupils are equal, round, and reactive to light. Conjunctivae and EOM are normal Nose: without d/c or deformity Neck: Neck supple. Gross normal ROM Cardiovascular: Normal rate and regular rhythm.   Pulmonary/Chest: Effort normal and breath sounds without rales or wheezing.  Abd:  Soft, NT, ND, + BS, no organomegaly Neurological: Pt is alert. At baseline orientation, motor grossly intact Skin: Skin is warm. No rashes, other new lesions, no LE edema Psychiatric: Pt behavior is normal without agitation  No other exam findings  Lab Results  Component Value Date   WBC 5.7 02/17/2018   HGB 15.6 (H) 02/17/2018   HCT 45.5 02/17/2018   PLT 266.0 02/17/2018   GLUCOSE 98 02/17/2018   CHOL 198 02/17/2018   TRIG 150.0 (H) 02/17/2018   HDL 52.70 02/17/2018   LDLDIRECT 106.0 02/09/2017   LDLCALC 115 (H) 02/17/2018   ALT 26 02/17/2018   AST 24 02/17/2018   NA 139 02/17/2018   K 3.5 02/17/2018   CL 100 02/17/2018   CREATININE 0.72 02/17/2018   BUN 10 02/17/2018   CO2 27 02/17/2018   TSH 3.12 02/17/2018   HGBA1C 5.5 02/17/2018   MICROALBUR 0.3 10/30/2010       Assessment & Plan:

## 2018-08-21 NOTE — Assessment & Plan Note (Signed)
Exam benign, etiology unclear, for labs as ordered

## 2018-08-21 NOTE — Patient Instructions (Signed)
Ok to stop the amlodipine  Please take all new medication as prescribed - the losartan 50 mg per day  Please continue all other medications as before, and refills have been done if requested.  Please have the pharmacy call with any other refills you may need.  Please continue your efforts at being more active, low cholesterol diet, and weight control.  You are otherwise up to date with prevention measures today.  Please keep your appointments with your specialists as you may have planned  Please go to the LAB in the Basement (turn left off the elevator) for the tests to be done today  You will be contacted by phone if any changes need to be made immediately.  Otherwise, you will receive a letter about your results with an explanation, but please check with MyChart first.  Please remember to sign up for MyChart if you have not done so, as this will be important to you in the future with finding out test results, communicating by private email, and scheduling acute appointments online when needed.

## 2018-08-21 NOTE — Assessment & Plan Note (Signed)
stable overall by history and exam, recent data reviewed with pt, and pt to continue medical treatment as before,  to f/u any worsening symptoms or concerns  

## 2018-08-23 LAB — CYCLIC CITRUL PEPTIDE ANTIBODY, IGG: Cyclic Citrullin Peptide Ab: <16 UNITS

## 2018-08-23 LAB — RHEUMATOID FACTOR: Rheumatoid fact SerPl-aCnc: <14 IU/mL (ref <14)

## 2018-08-23 LAB — ANTI-NUCLEAR AB-TITER (ANA TITER): ANA Titer 1: 1:40 {titer} — ABNORMAL HIGH

## 2018-08-23 LAB — ANA: Anti Nuclear Antibody (ANA): POSITIVE — AB

## 2018-08-23 LAB — ANTI-DNA ANTIBODY, DOUBLE-STRANDED: ds DNA Ab: <1 IU/mL

## 2018-08-24 ENCOUNTER — Encounter: Payer: Self-pay | Admitting: Internal Medicine

## 2018-10-29 ENCOUNTER — Other Ambulatory Visit: Payer: Self-pay

## 2018-10-29 ENCOUNTER — Ambulatory Visit
Admission: EM | Admit: 2018-10-29 | Discharge: 2018-10-29 | Disposition: A | Discharge status: Home / Self Care | Payer: BC Managed Care – PPO | Attending: Emergency Medicine | Admitting: Emergency Medicine

## 2018-10-29 DIAGNOSIS — L02412 Cutaneous abscess of left axilla: Secondary | ICD-10-CM | POA: Diagnosis not present

## 2018-10-29 DIAGNOSIS — L02419 Cutaneous abscess of limb, unspecified: Secondary | ICD-10-CM

## 2018-10-29 MED ORDER — DOXYCYCLINE HYCLATE 100 MG PO CAPS: 100.0000 mg | ORAL_CAPSULE | Freq: Two times a day (BID) | ORAL | 0 refills | Status: AC

## 2018-10-29 MED ORDER — FLUCONAZOLE 200 MG PO TABS: 200.0000 mg | ORAL_TABLET | Freq: Once | ORAL | 0 refills | Status: AC

## 2018-10-29 NOTE — ED Triage Notes (Signed)
Pt. States on Friday she felt an itch and a bump on her backside left arm. She didn't noticed a head on the bump and thought it could be a boil. She states the inflammation has went down a lot since, but she wants to have it looked at.

## 2018-10-29 NOTE — Discharge Instructions (Signed)
Keep the area clean and dry. °Use hot compresses for 5 minutes 3-4 times a day. °Take antibiotic as prescribed with food. ° °Avoid antiperspirants - look for deodorants without aluminum. °Avoid wearing underwire bras as this can irritate the area further.  °

## 2018-10-29 NOTE — ED Provider Notes (Signed)
[name]    CSN: [id number] Arrival date & time: 10/29/18  1230      History   Chief Complaint Chief Complaint  Patient presents with   Rash    HPI Lori Harmon is a 48 y.o. female presenting for left axillary itching, bump, redness since Friday.  Patient denies known trauma, bug bite to the area.  Patient denies history of abscess, folliculitis.  Patient had family member outlined area of erythema and states this is improved since symptom onset with use of Benadryl, though wanted to get lesion checked.  Patient denies headache, fever, arthralgias, myalgias, painful ROM of affected arm.   Past Medical History:  Diagnosis Date   ALLERGIC RHINITIS 01/27/2007   ANXIETY 01/27/2007   DEPRESSION 01/27/2007   DIABETES MELLITUS, TYPE II 01/27/2007   HYPERLIPIDEMIA 01/27/2007   HYPERTENSION 01/27/2007   Palpitations    PCOS (polycystic ovarian syndrome)    SINUSITIS- ACUTE-NOS 01/30/2008   TURNER'S SYNDROME 01/30/2008    Patient Active Problem List   Diagnosis Date Noted   Polymyalgia ([place]) 08/21/2018   Hypersomnolence 12/07/2016   Hematuria 08/13/2015   Preventative health care 12/07/2010   BACK PAIN 04/30/2009   TURNER'S SYNDROME 01/30/2008   Impaired glucose tolerance 01/27/2007   HLD (hyperlipidemia) 01/27/2007   Anxiety state 01/27/2007   Depression 01/27/2007   Essential hypertension 01/27/2007   ALLERGIC RHINITIS 01/27/2007    History reviewed. No pertinent surgical history.  OB History   No obstetric history on file.      Home Medications    Prior to Admission medications   Medication Sig Start Date End Date Taking? Authorizing Provider  modafinil (PROVIGIL) 200 MG tablet Take 1 tablet (200 mg total) by mouth daily. Take med at 8am daily 05/13/17  Yes Rigoberto Noel, MD  Armodafinil 150 MG tablet Take 1 tablet (150 mg total) by mouth daily. 04/07/17   Rigoberto Noel, MD  doxycycline (VIBRAMYCIN) 100 MG capsule Take 1  capsule (100 mg total) by mouth 2 (two) times daily for 7 days. 10/29/18 11/05/18  Hall-Potvin, [place], PA-C  escitalopram (LEXAPRO) 10 MG tablet Take 1 tablet (10 mg total) by mouth daily. 02/20/18   Biagio Borg, MD  fluconazole (DIFLUCAN) 200 MG tablet Take 1 tablet (200 mg total) by mouth once for 1 dose. May repeat in 72 hours if needed 10/29/18 10/29/18  Hall-Potvin, [place], PA-C  hydrochlorothiazide (HYDRODIURIL) 25 MG tablet Take 0.5 tablets (12.5 mg total) by mouth daily. 04/03/18   Biagio Borg, MD  ipratropium (ATROVENT) 0.06 % nasal spray Place 2 sprays into both nostrils 4 (four) times daily. 01/03/18   Tereasa Coop, PA-C  losartan (COZAAR) 50 MG tablet Take 1 tablet (50 mg total) by mouth daily. 08/21/18   Biagio Borg, MD  methylphenidate (RITALIN) 10 MG tablet Take 1 tablet (10 mg total) by mouth daily. 06/14/17 06/14/18  Rigoberto Noel, MD  Norgestim-Eth Radene Journey Triphasic (ORTHO TRI-CYCLEN, 28, PO) Take 1 tablet by mouth daily.    [provider]    Family History Family History  Problem Relation Age of Onset   Cancer Father        Skin Cancers   Heart attack Father    Diabetes Mother    Diabetes Other        Grandparents   Hypertension Other        FH of   Cancer Other        Aunt-Breast Cancer   Heart  attack Other        Aunt-MI in her 20's   Alcohol abuse Other        Uncle   Alcohol abuse Other        Grandmother   Cancer Other        Grandmother-Lung Cancer   Stroke Other     Social History Social History   Tobacco Use   Smoking status: Never Smoker   Smokeless tobacco: Never Used  Substance Use Topics   Alcohol use: Yes   Drug use: No     Allergies   Klonopin [clonazepam]   Review of Systems Review of Systems  Constitutional: Negative for fatigue and fever.  HENT: Negative for ear pain, sinus pain, sore throat and voice change.   Eyes: Negative for pain, redness and visual disturbance.  Respiratory: Negative for  cough and shortness of breath.   Cardiovascular: Negative for chest pain and palpitations.  Gastrointestinal: Negative for abdominal pain, diarrhea and vomiting.  Musculoskeletal: Negative for arthralgias and myalgias.  Skin: Positive for rash. Negative for wound.  Neurological: Negative for syncope and headaches.     Physical Exam Triage Vital Signs ED Triage Vitals  Enc Vitals Group     BP 10/29/18 1246 (!) 145/88     Pulse Rate 10/29/18 1246 78     Resp --      Temp 10/29/18 1246 97.9 F (36.6 C)     Temp Source 10/29/18 1246 Oral     SpO2 10/29/18 1246 98 %     Weight 10/29/18 1243 123 lb (55.8 kg)     Height --      Head Circumference --      Peak Flow --      Pain Score 10/29/18 1242 2     Pain Loc --      Pain Edu? --      Excl. in [place]? --    No data found.  Updated Vital Signs BP (!) 145/88 (BP Location: Right Arm)    Pulse 78    Temp 97.9 F (36.6 C) (Oral)    Wt 123 lb (55.8 kg)    LMP 10/07/2018    SpO2 98%    BMI 23.43 kg/m   Visual Acuity Right Eye Distance:   Left Eye Distance:   Bilateral Distance:    Right Eye Near:   Left Eye Near:    Bilateral Near:     Physical Exam Constitutional:      General: She is not in acute distress. HENT:     Head: Normocephalic and atraumatic.  Eyes:     General: No scleral icterus.    Pupils: Pupils are equal, round, and reactive to light.  Cardiovascular:     Rate and Rhythm: Normal rate.  Pulmonary:     Effort: Pulmonary effort is normal.  Musculoskeletal: Normal range of motion.        General: No swelling or tenderness.     Comments: Of left shoulder  Skin:    Capillary Refill: Capillary refill takes less than 2 seconds.     Comments: Large area outlined extending from left axilla to almost elbow without erythema within this line.  Erythema now focalized over 2 cm area of induration without fluctuance, open wound, discharge.  Mildly tender and warm to touch.   Neurological:     General: No focal deficit  present.     Mental Status: She is alert and oriented to person, place, and time.  Sensory: No sensory deficit.     Motor: No weakness.      UC Treatments / Results  Labs (all labs ordered are listed, but only abnormal results are displayed) Labs Reviewed - No data to display  EKG   Radiology No results found.  Procedures Procedures (including critical care time)  Medications Ordered in UC Medications - No data to display  Initial Impression / Assessment and Plan / UC Course  I have reviewed the triage vital signs and the nursing notes.  Pertinent labs & imaging results that were available during my care of the patient were reviewed by me and considered in my medical decision making (see chart for details).     Reviewed first-line treatment of I&D: Patient declined.  Will initiate doxycycline today, add hot compresses to current therapy.  Patient reporting yeast infections status post antibiotic use: Diflucan sent.  Return precautions discussed, patient verbalized understanding and is agreeable to plan. Final Clinical Impressions(s) / UC Diagnoses   Final diagnoses:  Axillary abscess     Discharge Instructions     Keep the area clean and dry. Use hot compresses for 5 minutes 3-4 times a day. Take antibiotic as prescribed with food.  Avoid antiperspirants - look for deodorants without aluminum. Avoid wearing underwire bras as this can irritate the area further.     ED Prescriptions    Medication Sig Dispense Auth. Provider   doxycycline (VIBRAMYCIN) 100 MG capsule Take 1 capsule (100 mg total) by mouth 2 (two) times daily for 7 days. 14 capsule Hall-Potvin, [place], PA-C   fluconazole (DIFLUCAN) 200 MG tablet Take 1 tablet (200 mg total) by mouth once for 1 dose. May repeat in 72 hours if needed 2 tablet Hall-Potvin, [place], PA-C     PDMP not reviewed this encounter.   Hall-Potvin, [place], [place] 10/29/18 1712

## 2019-03-20 ENCOUNTER — Telehealth: Payer: Self-pay | Admitting: Internal Medicine

## 2019-03-20 ENCOUNTER — Telehealth: Payer: Self-pay

## 2019-03-20 DIAGNOSIS — E559 Vitamin D deficiency, unspecified: Secondary | ICD-10-CM

## 2019-03-20 DIAGNOSIS — E538 Deficiency of other specified B group vitamins: Secondary | ICD-10-CM

## 2019-03-20 DIAGNOSIS — E611 Iron deficiency: Secondary | ICD-10-CM

## 2019-03-20 DIAGNOSIS — R739 Hyperglycemia, unspecified: Secondary | ICD-10-CM

## 2019-03-20 DIAGNOSIS — Z0000 Encounter for general adult medical examination without abnormal findings: Secondary | ICD-10-CM

## 2019-03-20 NOTE — Telephone Encounter (Signed)
New message    The patient has upcoming physical on 4.1.21, asking can labs be put into the system prior to appt.

## 2019-03-20 NOTE — Telephone Encounter (Signed)
Ok labs are ordered 

## 2019-03-20 NOTE — Telephone Encounter (Signed)
Ok this is done 

## 2019-03-21 NOTE — Telephone Encounter (Signed)
Called and left message for patient to return call and schedule lab appointment.

## 2019-03-22 ENCOUNTER — Other Ambulatory Visit: Payer: Self-pay | Admitting: Internal Medicine

## 2019-03-22 NOTE — Telephone Encounter (Signed)
Called and left message for patient to return call and schedule lab appointment.

## 2019-04-02 ENCOUNTER — Other Ambulatory Visit (INDEPENDENT_AMBULATORY_CARE_PROVIDER_SITE_OTHER): Payer: BC Managed Care – PPO

## 2019-04-02 DIAGNOSIS — R739 Hyperglycemia, unspecified: Secondary | ICD-10-CM | POA: Diagnosis not present

## 2019-04-02 DIAGNOSIS — E611 Iron deficiency: Secondary | ICD-10-CM

## 2019-04-02 DIAGNOSIS — E559 Vitamin D deficiency, unspecified: Secondary | ICD-10-CM

## 2019-04-02 DIAGNOSIS — E538 Deficiency of other specified B group vitamins: Secondary | ICD-10-CM | POA: Diagnosis not present

## 2019-04-02 DIAGNOSIS — Z0000 Encounter for general adult medical examination without abnormal findings: Secondary | ICD-10-CM

## 2019-04-02 LAB — HEPATIC FUNCTION PANEL
ALT: 12 U/L (ref 0–35)
AST: 13 U/L (ref 0–37)
Albumin: 4.4 g/dL (ref 3.5–5.2)
Alkaline Phosphatase: 51 U/L (ref 39–117)
Bilirubin, Direct: 0.1 mg/dL (ref 0.0–0.3)
Total Bilirubin: 0.4 mg/dL (ref 0.2–1.2)
Total Protein: 7.2 g/dL (ref 6.0–8.3)

## 2019-04-02 LAB — IBC PANEL
Iron: 66 ug/dL (ref 42–145)
Saturation Ratios: 22.9 % (ref 20.0–50.0)
Transferrin: 206 mg/dL — ABNORMAL LOW (ref 212.0–360.0)

## 2019-04-02 LAB — URINALYSIS, ROUTINE W REFLEX MICROSCOPIC
Bilirubin Urine: NEGATIVE
Hgb urine dipstick: NEGATIVE
Ketones, ur: NEGATIVE
Leukocytes,Ua: NEGATIVE
Nitrite: NEGATIVE
RBC / HPF: NONE SEEN (ref 0–?)
Specific Gravity, Urine: 1.015 (ref 1.000–1.030)
Total Protein, Urine: NEGATIVE
Urine Glucose: NEGATIVE
Urobilinogen, UA: 0.2 (ref 0.0–1.0)
WBC, UA: NONE SEEN (ref 0–?)
pH: 7 (ref 5.0–8.0)

## 2019-04-02 LAB — CBC WITH DIFFERENTIAL/PLATELET
Basophils Absolute: 0 10*3/uL (ref 0.0–0.1)
Basophils Relative: 0.9 % (ref 0.0–3.0)
Eosinophils Absolute: 0.3 10*3/uL (ref 0.0–0.7)
Eosinophils Relative: 5.6 % — ABNORMAL HIGH (ref 0.0–5.0)
HCT: 41.5 % (ref 36.0–46.0)
Hemoglobin: 14 g/dL (ref 12.0–15.0)
Lymphocytes Relative: 29.6 % (ref 12.0–46.0)
Lymphs Abs: 1.6 10*3/uL (ref 0.7–4.0)
MCHC: 33.7 g/dL (ref 30.0–36.0)
MCV: 93.9 fl (ref 78.0–100.0)
Monocytes Absolute: 1 10*3/uL (ref 0.1–1.0)
Monocytes Relative: 19.2 % — ABNORMAL HIGH (ref 3.0–12.0)
Neutro Abs: 2.4 10*3/uL (ref 1.4–7.7)
Neutrophils Relative %: 44.7 % (ref 43.0–77.0)
Platelets: 239 10*3/uL (ref 150.0–400.0)
RBC: 4.42 Mil/uL (ref 3.87–5.11)
RDW: 12.8 % (ref 11.5–15.5)
WBC: 5.3 10*3/uL (ref 4.0–10.5)

## 2019-04-02 LAB — LIPID PANEL
Cholesterol: 146 mg/dL (ref 0–200)
HDL: 36.5 mg/dL — ABNORMAL LOW (ref >39.00)
LDL Cholesterol: 79 mg/dL (ref 0–99)
NonHDL: 109.52
Total CHOL/HDL Ratio: 4
Triglycerides: 152 mg/dL — ABNORMAL HIGH (ref 0.0–149.0)
VLDL: 30.4 mg/dL (ref 0.0–40.0)

## 2019-04-02 LAB — BASIC METABOLIC PANEL
BUN: 9 mg/dL (ref 6–23)
CO2: 25 mEq/L (ref 19–32)
Calcium: 9.2 mg/dL (ref 8.4–10.5)
Chloride: 103 mEq/L (ref 96–112)
Creatinine, Ser: 0.69 mg/dL (ref 0.40–1.20)
GFR: 90.42 mL/min (ref >60.00)
Glucose, Bld: 116 mg/dL — ABNORMAL HIGH (ref 70–99)
Potassium: 3.7 mEq/L (ref 3.5–5.1)
Sodium: 136 mEq/L (ref 135–145)

## 2019-04-02 LAB — VITAMIN B12: Vitamin B-12: 291 pg/mL (ref 211–911)

## 2019-04-02 LAB — HEMOGLOBIN A1C: Hgb A1c MFr Bld: 5.8 % (ref 4.6–6.5)

## 2019-04-02 LAB — VITAMIN D 25 HYDROXY (VIT D DEFICIENCY, FRACTURES): VITD: 30.67 ng/mL (ref 30.00–100.00)

## 2019-04-02 LAB — TSH: TSH: 2.82 u[IU]/mL (ref 0.35–4.50)

## 2019-04-05 ENCOUNTER — Other Ambulatory Visit: Payer: Self-pay

## 2019-04-05 ENCOUNTER — Encounter: Payer: Self-pay | Admitting: Internal Medicine

## 2019-04-05 ENCOUNTER — Ambulatory Visit (INDEPENDENT_AMBULATORY_CARE_PROVIDER_SITE_OTHER): Payer: BC Managed Care – PPO | Admitting: Internal Medicine

## 2019-04-05 VITALS — BP 140/82 | HR 82 | Temp 98.7°F | Ht 60.75 in | Wt 124.2 lb

## 2019-04-05 DIAGNOSIS — Z0000 Encounter for general adult medical examination without abnormal findings: Secondary | ICD-10-CM | POA: Diagnosis not present

## 2019-04-05 DIAGNOSIS — R7302 Impaired glucose tolerance (oral): Secondary | ICD-10-CM

## 2019-04-05 DIAGNOSIS — I10 Essential (primary) hypertension: Secondary | ICD-10-CM | POA: Diagnosis not present

## 2019-04-05 MED ORDER — HYDROCHLOROTHIAZIDE 25 MG PO TABS: 12.5000 mg | ORAL_TABLET | Freq: Every day | ORAL | 3 refills | Status: DC

## 2019-04-05 MED ORDER — ESCITALOPRAM OXALATE 10 MG PO TABS: 10.0000 mg | ORAL_TABLET | Freq: Every day | ORAL | 3 refills | Status: DC

## 2019-04-05 MED ORDER — LOSARTAN POTASSIUM 50 MG PO TABS: 50.0000 mg | ORAL_TABLET | Freq: Every day | ORAL | 3 refills | Status: DC

## 2019-04-05 NOTE — Patient Instructions (Signed)

## 2019-04-05 NOTE — Progress Notes (Signed)
Subjective:    Patient ID: Lori Harmon, female    DOB: 03/14/1970, 49 y.o.   MRN: [id number]  HPI  Here for wellness and f/u;  Overall doing ok;  Pt denies Chest pain, worsening SOB, DOE, wheezing, orthopnea, PND, worsening LE edema, palpitations, dizziness or syncope.  Pt denies neurological change such as new headache, facial or extremity weakness.  Pt denies polydipsia, polyuria, or low sugar symptoms. Pt states overall good compliance with treatment and medications, good tolerability, and has been trying to follow appropriate diet.  Pt denies worsening depressive symptoms, suicidal ideation or panic. No fever, night sweats, wt loss, loss of appetite, or other constitutional symptoms.  Pt states good ability with ADL's, has low fall risk, home safety reviewed and adequate, no other significant changes in hearing or vision, and only occasionally active with exercise. No new complaints Past Medical History:  Diagnosis Date  . ALLERGIC RHINITIS 01/27/2007  . ANXIETY 01/27/2007  . DEPRESSION 01/27/2007  . DIABETES MELLITUS, TYPE II 01/27/2007  . HYPERLIPIDEMIA 01/27/2007  . HYPERTENSION 01/27/2007  . Palpitations   . PCOS (polycystic ovarian syndrome)   . SINUSITIS- ACUTE-NOS 01/30/2008  . 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 No pertinent surgical history.  reports that she has never smoked. She has never used smokeless tobacco. She reports current alcohol use. She reports that she does not use drugs. family history includes Alcohol abuse in some other family members; Cancer in her father and other family members; Diabetes in her mother and another family member; Heart attack in her father and another family member; Hypertension in an other family member; Stroke in an other family member. Allergies  Allergen Reactions  . Klonopin [Clonazepam]     fatigue   Current Outpatient Medications on File Prior to Visit  Medication Sig Dispense Refill  . ipratropium (ATROVENT) 0.06 %  nasal spray Place 2 sprays into both nostrils 4 (four) times daily. 15 mL 0  . Norgestim-Eth Estrad Triphasic (ORTHO TRI-CYCLEN, 28, PO) Take 1 tablet by mouth daily.     No current facility-administered medications on file prior to visit.   Review of Systems All otherwise neg per pt    Objective:   Physical Exam BP 140/82   Pulse 82   Temp 98.7 F (37.1 C)   Ht 5' 0.75" (1.543 m)   Wt 124 lb 3.2 oz (56.3 kg)   SpO2 99%   BMI 23.66 kg/m  VS noted,  Constitutional: Pt appears in NAD HENT: Head: NCAT.  Right Ear: External ear normal.  Left Ear: External ear normal.  Eyes: . Pupils are equal, round, and reactive to light. Conjunctivae and EOM are normal Nose: without d/c or deformity Neck: Neck supple. Gross normal ROM Cardiovascular: Normal rate and regular rhythm.   Pulmonary/Chest: Effort normal and breath sounds without rales or wheezing.  Abd:  Soft, NT, ND, + BS, no organomegaly Neurological: Pt is alert. At baseline orientation, motor grossly intact Skin: Skin is warm. No rashes, other new lesions, no LE edema Psychiatric: Pt behavior is normal without agitation  All otherwise neg per pt Lab Results  Component Value Date   WBC 5.3 04/02/2019   HGB 14.0 04/02/2019   HCT 41.5 04/02/2019   PLT 239.0 04/02/2019   GLUCOSE 116 (H) 04/02/2019   CHOL 146 04/02/2019   TRIG 152.0 (H) 04/02/2019   HDL 36.50 (L) 04/02/2019   LDLDIRECT 106.0 02/09/2017   LDLCALC 79 04/02/2019   ALT 12 04/02/2019   AST  13 04/02/2019   NA 136 04/02/2019   K 3.7 04/02/2019   CL 103 04/02/2019   CREATININE 0.69 04/02/2019   BUN 9 04/02/2019   CO2 25 04/02/2019   TSH 2.82 04/02/2019   HGBA1C 5.8 04/02/2019   MICROALBUR 0.3 10/30/2010          Assessment & Plan:

## 2019-04-06 ENCOUNTER — Encounter: Payer: Self-pay | Admitting: Internal Medicine

## 2019-04-06 NOTE — Assessment & Plan Note (Signed)

## 2019-04-06 NOTE — Assessment & Plan Note (Signed)
stable overall by history and exam, recent data reviewed with pt, and pt to continue medical treatment as before,  to f/u any worsening symptoms or concerns  

## 2019-08-26 ENCOUNTER — Other Ambulatory Visit: Payer: Self-pay | Admitting: Internal Medicine

## 2019-08-26 NOTE — Telephone Encounter (Signed)
Please refill as per office routine med refill policy (all routine meds refilled for 3 mo or monthly per pt preference up to one year from last visit, then month to month grace period for 3 mo, then further med refills will have to be denied)  

## 2019-08-29 ENCOUNTER — Encounter: Payer: Self-pay | Admitting: Internal Medicine

## 2019-08-30 ENCOUNTER — Other Ambulatory Visit: Payer: Self-pay

## 2019-08-30 MED ORDER — LOSARTAN POTASSIUM 50 MG PO TABS: 50.0000 mg | ORAL_TABLET | Freq: Every day | ORAL | 3 refills | Status: DC

## 2019-09-21 ENCOUNTER — Other Ambulatory Visit: Payer: Self-pay | Admitting: Internal Medicine

## 2019-09-26 ENCOUNTER — Encounter: Payer: Self-pay | Admitting: Internal Medicine

## 2019-09-27 NOTE — Telephone Encounter (Signed)
Please refill all meds as per office routine med refill policy (all routine meds refilled for 3 mo or monthly per pt preference up to one year from last visit, then month to month grace period for 3 mo, then further med refills will have to be denied)

## 2019-09-28 ENCOUNTER — Other Ambulatory Visit: Payer: Self-pay

## 2019-09-28 MED ORDER — ESCITALOPRAM OXALATE 10 MG PO TABS
10.0000 mg | ORAL_TABLET | Freq: Every day | ORAL | 1 refills | Status: DC
Start: 2019-09-28 — End: 2020-04-20

## 2019-09-28 MED ORDER — LOSARTAN POTASSIUM 50 MG PO TABS
50.0000 mg | ORAL_TABLET | Freq: Every day | ORAL | 1 refills | Status: DC
Start: 2019-09-28 — End: 2020-05-16

## 2019-09-28 MED ORDER — HYDROCHLOROTHIAZIDE 25 MG PO TABS
12.5000 mg | ORAL_TABLET | Freq: Every day | ORAL | 3 refills | Status: DC
Start: 2019-09-28 — End: 2020-05-16

## 2019-12-28 ENCOUNTER — Encounter: Payer: Self-pay | Admitting: Internal Medicine

## 2020-01-03 NOTE — Telephone Encounter (Signed)
   Patient seeking advice  She states she feels pretty good other than she has continued to have congestion. No other symptoms Should she continue Mucinex Should she continue to quarantine

## 2020-01-04 ENCOUNTER — Encounter: Payer: Self-pay | Admitting: Internal Medicine

## 2020-01-07 ENCOUNTER — Encounter: Payer: Self-pay | Admitting: Internal Medicine

## 2020-01-07 NOTE — Telephone Encounter (Signed)
Ok for letter of work for the days she needs - to [place] please

## 2020-01-07 NOTE — Telephone Encounter (Signed)
Pt calling regarding messages left. Please call the patient [phone]

## 2020-04-20 ENCOUNTER — Other Ambulatory Visit: Payer: Self-pay | Admitting: Internal Medicine

## 2020-04-20 NOTE — Telephone Encounter (Signed)
Ok to contact pt -   Please to make return office visit for further refills lexapro if does not already have appt - last seen April 2021

## 2020-04-23 NOTE — Telephone Encounter (Signed)
Tried calling patient to schedule appointment. No answer, left message for patient to call back and schedule.

## 2020-05-13 ENCOUNTER — Other Ambulatory Visit: Payer: Self-pay | Admitting: Internal Medicine

## 2020-05-13 ENCOUNTER — Encounter: Payer: Self-pay | Admitting: Internal Medicine

## 2020-05-13 DIAGNOSIS — E538 Deficiency of other specified B group vitamins: Secondary | ICD-10-CM

## 2020-05-13 DIAGNOSIS — E785 Hyperlipidemia, unspecified: Secondary | ICD-10-CM

## 2020-05-13 DIAGNOSIS — E559 Vitamin D deficiency, unspecified: Secondary | ICD-10-CM

## 2020-05-13 DIAGNOSIS — R7302 Impaired glucose tolerance (oral): Secondary | ICD-10-CM

## 2020-05-15 ENCOUNTER — Other Ambulatory Visit: Payer: Self-pay

## 2020-05-15 ENCOUNTER — Other Ambulatory Visit (INDEPENDENT_AMBULATORY_CARE_PROVIDER_SITE_OTHER): Payer: BC Managed Care – PPO

## 2020-05-15 DIAGNOSIS — E785 Hyperlipidemia, unspecified: Secondary | ICD-10-CM

## 2020-05-15 DIAGNOSIS — E538 Deficiency of other specified B group vitamins: Secondary | ICD-10-CM

## 2020-05-15 DIAGNOSIS — E559 Vitamin D deficiency, unspecified: Secondary | ICD-10-CM | POA: Diagnosis not present

## 2020-05-15 DIAGNOSIS — R7302 Impaired glucose tolerance (oral): Secondary | ICD-10-CM | POA: Diagnosis not present

## 2020-05-15 LAB — URINALYSIS, ROUTINE W REFLEX MICROSCOPIC
Bilirubin Urine: NEGATIVE
Hgb urine dipstick: NEGATIVE
Ketones, ur: NEGATIVE
Leukocytes,Ua: NEGATIVE
Nitrite: NEGATIVE
Specific Gravity, Urine: 1.01 (ref 1.000–1.030)
Total Protein, Urine: NEGATIVE
Urine Glucose: NEGATIVE
Urobilinogen, UA: 0.2 (ref 0.0–1.0)
pH: 6.5 (ref 5.0–8.0)

## 2020-05-15 LAB — CBC WITH DIFFERENTIAL/PLATELET
Basophils Absolute: 0 10*3/uL (ref 0.0–0.1)
Basophils Relative: 0.8 % (ref 0.0–3.0)
Eosinophils Absolute: 0.3 10*3/uL (ref 0.0–0.7)
Eosinophils Relative: 5.1 % — ABNORMAL HIGH (ref 0.0–5.0)
HCT: 40.3 % (ref 36.0–46.0)
Hemoglobin: 13.8 g/dL (ref 12.0–15.0)
Lymphocytes Relative: 39.7 % (ref 12.0–46.0)
Lymphs Abs: 2.2 10*3/uL (ref 0.7–4.0)
MCHC: 34.2 g/dL (ref 30.0–36.0)
MCV: 93 fl (ref 78.0–100.0)
Monocytes Absolute: 0.7 10*3/uL (ref 0.1–1.0)
Monocytes Relative: 12.7 % — ABNORMAL HIGH (ref 3.0–12.0)
Neutro Abs: 2.3 10*3/uL (ref 1.4–7.7)
Neutrophils Relative %: 41.7 % — ABNORMAL LOW (ref 43.0–77.0)
Platelets: 266 10*3/uL (ref 150.0–400.0)
RBC: 4.33 Mil/uL (ref 3.87–5.11)
RDW: 12.5 % (ref 11.5–15.5)
WBC: 5.6 10*3/uL (ref 4.0–10.5)

## 2020-05-15 LAB — TSH: TSH: 2.85 u[IU]/mL (ref 0.35–4.50)

## 2020-05-15 LAB — HEPATIC FUNCTION PANEL
ALT: 24 U/L (ref 0–35)
AST: 23 U/L (ref 0–37)
Albumin: 4.5 g/dL (ref 3.5–5.2)
Alkaline Phosphatase: 57 U/L (ref 39–117)
Bilirubin, Direct: 0.1 mg/dL (ref 0.0–0.3)
Total Bilirubin: 0.9 mg/dL (ref 0.2–1.2)
Total Protein: 7.4 g/dL (ref 6.0–8.3)

## 2020-05-15 LAB — LIPID PANEL
Cholesterol: 184 mg/dL (ref 0–200)
HDL: 52.4 mg/dL (ref >39.00)
LDL Cholesterol: 104 mg/dL — ABNORMAL HIGH (ref 0–99)
NonHDL: 131.53
Total CHOL/HDL Ratio: 4
Triglycerides: 136 mg/dL (ref 0.0–149.0)
VLDL: 27.2 mg/dL (ref 0.0–40.0)

## 2020-05-15 LAB — BASIC METABOLIC PANEL
BUN: 12 mg/dL (ref 6–23)
CO2: 26 mEq/L (ref 19–32)
Calcium: 9.8 mg/dL (ref 8.4–10.5)
Chloride: 105 mEq/L (ref 96–112)
Creatinine, Ser: 0.79 mg/dL (ref 0.40–1.20)
GFR: 87.39 mL/min (ref >60.00)
Glucose, Bld: 115 mg/dL — ABNORMAL HIGH (ref 70–99)
Potassium: 4.6 mEq/L (ref 3.5–5.1)
Sodium: 139 mEq/L (ref 135–145)

## 2020-05-15 LAB — HEMOGLOBIN A1C: Hgb A1c MFr Bld: 5.8 % (ref 4.6–6.5)

## 2020-05-15 LAB — VITAMIN D 25 HYDROXY (VIT D DEFICIENCY, FRACTURES): VITD: 19.43 ng/mL — ABNORMAL LOW (ref 30.00–100.00)

## 2020-05-15 LAB — VITAMIN B12: Vitamin B-12: 412 pg/mL (ref 211–911)

## 2020-05-16 ENCOUNTER — Ambulatory Visit (INDEPENDENT_AMBULATORY_CARE_PROVIDER_SITE_OTHER): Payer: BC Managed Care – PPO | Admitting: Internal Medicine

## 2020-05-16 ENCOUNTER — Encounter: Payer: Self-pay | Admitting: Internal Medicine

## 2020-05-16 ENCOUNTER — Other Ambulatory Visit: Payer: Self-pay

## 2020-05-16 VITALS — BP 118/70 | HR 88 | Temp 98.1°F | Ht 60.75 in | Wt 121.0 lb

## 2020-05-16 DIAGNOSIS — Z1159 Encounter for screening for other viral diseases: Secondary | ICD-10-CM | POA: Diagnosis not present

## 2020-05-16 DIAGNOSIS — Z23 Encounter for immunization: Secondary | ICD-10-CM

## 2020-05-16 DIAGNOSIS — I10 Essential (primary) hypertension: Secondary | ICD-10-CM

## 2020-05-16 DIAGNOSIS — E7800 Pure hypercholesterolemia, unspecified: Secondary | ICD-10-CM | POA: Diagnosis not present

## 2020-05-16 DIAGNOSIS — E559 Vitamin D deficiency, unspecified: Secondary | ICD-10-CM

## 2020-05-16 DIAGNOSIS — R7302 Impaired glucose tolerance (oral): Secondary | ICD-10-CM | POA: Diagnosis not present

## 2020-05-16 DIAGNOSIS — Z0001 Encounter for general adult medical examination with abnormal findings: Secondary | ICD-10-CM | POA: Diagnosis not present

## 2020-05-16 MED ORDER — ESCITALOPRAM OXALATE 10 MG PO TABS: 1.0000 | ORAL_TABLET | Freq: Every day | ORAL | 3 refills | Status: DC

## 2020-05-16 MED ORDER — LOSARTAN POTASSIUM 50 MG PO TABS: 50.0000 mg | ORAL_TABLET | Freq: Every day | ORAL | 3 refills | Status: DC

## 2020-05-16 NOTE — Patient Instructions (Addendum)
You had the Tdap tetanus shot today  Please continue all other medications as before, and refills have been done if requested.  Please have the pharmacy call with any other refills you may need.  Please continue your efforts at being more active, low cholesterol diet, and weight control.  You are otherwise up to date with prevention measures today.  Please keep your appointments with your specialists as you may have planned  Please make an Appointment to return for your 1 year visit, or sooner if needed, with Lab testing by Appointment as well, to be done about 3-5 days before at the FIRST FLOOR Lab (so this is for TWO appointments - please see the scheduling desk as you leave)  Due to the ongoing Covid 19 pandemic, our lab now requires an appointment for any labs done at our office.  If you need labs done and do not have an appointment, please call our office ahead of time to schedule before presenting to the lab for your testing.  

## 2020-05-16 NOTE — Progress Notes (Signed)
Patient ID: Lori Harmon, female   DOB: 04-12-1970, 50 y.o.   MRN: [id number]         Chief Complaint:: wellness exam and low vit d, hyperglycemia, hld       HPI:  Lori Harmon is a 50 y.o. female here for wellness exam; due for Tdap, hep c screen, and o/w up to date with preventive referrals and immunizations                        Also Not taking the HCT as BP at home < 140/90 and did not need.  Not taking Vit D.  Pt denies chest pain, increased sob or doe, wheezing, orthopnea, PND, increased LE swelling, palpitations, dizziness or syncope.   Pt denies polydipsia, polyuria, or new focal neuro s/s.  Trying to follow a lower cholesterol diet.   Pt denies fever, wt loss, night sweats, loss of appetite, or other constitutional symptoms  No other new complaints.  Denies worsening depressive symptoms, suicidal ideation, or panic Wt Readings from Last 3 Encounters:  05/16/20 121 lb (54.9 kg)  04/05/19 124 lb 3.2 oz (56.3 kg)  10/29/18 123 lb (55.8 kg)   BP Readings from Last 3 Encounters:  05/16/20 118/70  04/05/19 140/82  10/29/18 (!) 145/88   Immunization History  Administered Date(s) Administered  . Influenza Whole 10/05/2007  . Influenza,inj,Quad PF,6+ Mos 10/09/2014  . Influenza-Unspecified 10/18/2016, 10/14/2017  . PFIZER(Purple Top)SARS-COV-2 Vaccination 03/10/2019, 03/31/2019, 11/28/2019  . Td 02/28/2008  . Tdap 05/16/2020   Health Maintenance Due  Topic Date Due  . Hepatitis C Screening  Never done      Past Medical History:  Diagnosis Date  . ALLERGIC RHINITIS 01/27/2007  . ANXIETY 01/27/2007  . DEPRESSION 01/27/2007  . DIABETES MELLITUS, TYPE II 01/27/2007  . HYPERLIPIDEMIA 01/27/2007  . HYPERTENSION 01/27/2007  . Palpitations   . PCOS (polycystic ovarian syndrome)   . SINUSITIS- ACUTE-NOS 01/30/2008  . 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 No pertinent surgical history.  reports that she has never smoked. She has never used smokeless tobacco. She  reports current alcohol use. She reports that she does not use drugs. family history includes Alcohol abuse in some other family members; Cancer in her father and other family members; Diabetes in her mother and another family member; Heart attack in her father and another family member; Hypertension in an other family member; Stroke in an other family member. Allergies  Allergen Reactions  . Klonopin [Clonazepam]     fatigue   Current Outpatient Medications on File Prior to Visit  Medication Sig Dispense Refill  . ipratropium (ATROVENT) 0.06 % nasal spray Place 2 sprays into both nostrils 4 (four) times daily. 15 mL 0  . Norgestim-Eth Estrad Triphasic (ORTHO TRI-CYCLEN, 28, PO) Take 1 tablet by mouth daily.     No current facility-administered medications on file prior to visit.        ROS:  All others reviewed and negative.  Objective        PE:  BP 118/70 (BP Location: Right Arm, Patient Position: Sitting, Cuff Size: Normal)   Pulse 88   Temp 98.1 F (36.7 C) (Oral)   Ht 5' 0.75" (1.543 m)   Wt 121 lb (54.9 kg)   LMP 05/06/2020   SpO2 99%   BMI 23.05 kg/m                 Constitutional: Pt appears in NAD  HENT: Head: NCAT.                Right Ear: External ear normal.                 Left Ear: External ear normal.                Eyes: . Pupils are equal, round, and reactive to light. Conjunctivae and EOM are normal               Nose: without d/c or deformity               Neck: Neck supple. Gross normal ROM               Cardiovascular: Normal rate and regular rhythm.                 Pulmonary/Chest: Effort normal and breath sounds without rales or wheezing.                Abd:  Soft, NT, ND, + BS, no organomegaly               Neurological: Pt is alert. At baseline orientation, motor grossly intact               Skin: Skin is warm. No rashes, no other new lesions, LE edema - none               Psychiatric: Pt behavior is normal without agitation   Micro:  none  Cardiac tracings I have personally interpreted today:  none  Pertinent Radiological findings (summarize): none   Lab Results  Component Value Date   WBC 5.6 05/15/2020   HGB 13.8 05/15/2020   HCT 40.3 05/15/2020   PLT 266.0 05/15/2020   GLUCOSE 115 (H) 05/15/2020   CHOL 184 05/15/2020   TRIG 136.0 05/15/2020   HDL 52.40 05/15/2020   LDLDIRECT 106.0 02/09/2017   LDLCALC 104 (H) 05/15/2020   ALT 24 05/15/2020   AST 23 05/15/2020   NA 139 05/15/2020   K 4.6 05/15/2020   CL 105 05/15/2020   CREATININE 0.79 05/15/2020   BUN 12 05/15/2020   CO2 26 05/15/2020   TSH 2.85 05/15/2020   HGBA1C 5.8 05/15/2020   MICROALBUR 0.3 10/30/2010   Assessment/Plan:  Lori Harmon is a 50 y.o. White or Caucasian [1] female with  has a past medical history of ALLERGIC RHINITIS (01/27/2007), ANXIETY (01/27/2007), DEPRESSION (01/27/2007), DIABETES MELLITUS, TYPE II (01/27/2007), HYPERLIPIDEMIA (01/27/2007), HYPERTENSION (01/27/2007), Palpitations, PCOS (polycystic ovarian syndrome), SINUSITIS- ACUTE-NOS (01/30/2008), and TURNER'S SYNDROME (01/30/2008).  Encounter for well adult exam with abnormal findings Age and sex appropriate education and counseling updated with regular exercise and diet Referrals for preventative services - for hep c screen Immunizations addressed - for Tdap Smoking counseling  - none needed Evidence for depression or other mood disorder - none significant Most recent labs reviewed. I have personally reviewed and have noted: 1) the patient's medical and social history 2) The patient's current medications and supplements 3) The patient's height, weight, and BMI have been recorded in the chart   Impaired glucose tolerance Lab Results  Component Value Date   HGBA1C 5.8 05/15/2020   Stable, pt to continue current medical treatment  - diet   Essential hypertension BP Readings from Last 3 Encounters:  05/16/20 118/70  04/05/19 140/82  10/29/18 (!) 145/88   Stable,  pt to continue medical treatment  - wt control, low salt   HLD (  hyperlipidemia) Lab Results  Component Value Date   LDLCALC 104 (H) 05/15/2020   Stable, pt to continue current low chol diet, dcelines statin   Vitamin D deficiency Last vitamin D Lab Results  Component Value Date   VD25OH 19.43 (L) 05/15/2020   Low, to start oral replacement   Followup: Return in about 1 year (around 05/16/2021).  Cathlean Cower, MD 05/22/2020 4:29 AM [facility]

## 2020-05-22 ENCOUNTER — Encounter: Payer: Self-pay | Admitting: Internal Medicine

## 2020-05-22 DIAGNOSIS — E559 Vitamin D deficiency, unspecified: Secondary | ICD-10-CM | POA: Insufficient documentation

## 2020-05-22 NOTE — Assessment & Plan Note (Signed)
Lab Results  Component Value Date   LDLCALC 104 (H) 05/15/2020   Stable, pt to continue current low chol diet, dcelines statin

## 2020-05-22 NOTE — Assessment & Plan Note (Addendum)
Age and sex appropriate education and counseling updated with regular exercise and diet Referrals for preventative services - for hep c screen Immunizations addressed - for Tdap Smoking counseling  - none needed Evidence for depression or other mood disorder - none significant Most recent labs reviewed. I have personally reviewed and have noted: 1) the patient's medical and social history 2) The patient's current medications and supplements 3) The patient's height, weight, and BMI have been recorded in the chart

## 2020-05-22 NOTE — Assessment & Plan Note (Signed)
Last vitamin D Lab Results  Component Value Date   VD25OH 19.43 (L) 05/15/2020   Low, to start oral replacement

## 2020-05-22 NOTE — Assessment & Plan Note (Signed)
BP Readings from Last 3 Encounters:  05/16/20 118/70  04/05/19 140/82  10/29/18 (!) 145/88   Stable, pt to continue medical treatment  - wt control, low salt

## 2020-05-22 NOTE — Assessment & Plan Note (Signed)
Lab Results  Component Value Date   HGBA1C 5.8 05/15/2020   Stable, pt to continue current medical treatment  - diet

## 2020-07-14 LAB — HM MAMMOGRAPHY

## 2020-07-16 ENCOUNTER — Encounter: Payer: Self-pay | Admitting: Internal Medicine

## 2020-07-28 ENCOUNTER — Other Ambulatory Visit: Payer: Self-pay | Admitting: Obstetrics and Gynecology

## 2020-07-28 DIAGNOSIS — R928 Other abnormal and inconclusive findings on diagnostic imaging of breast: Secondary | ICD-10-CM

## 2020-08-13 ENCOUNTER — Ambulatory Visit: Payer: BC Managed Care – PPO

## 2020-08-13 ENCOUNTER — Other Ambulatory Visit: Payer: Self-pay

## 2020-08-13 ENCOUNTER — Ambulatory Visit
Admission: RE | Admit: 2020-08-13 | Discharge: 2020-08-13 | Disposition: A | Discharge status: Home / Self Care | Payer: BC Managed Care – PPO | Source: Ambulatory Visit | Attending: Obstetrics and Gynecology | Admitting: Obstetrics and Gynecology

## 2020-08-13 DIAGNOSIS — R928 Other abnormal and inconclusive findings on diagnostic imaging of breast: Secondary | ICD-10-CM

## 2020-11-04 ENCOUNTER — Encounter: Payer: Self-pay | Admitting: Internal Medicine

## 2020-12-06 ENCOUNTER — Ambulatory Visit: Admit: 2020-12-06 | Discharge status: Against Medical Advice | Payer: BC Managed Care – PPO

## 2021-03-17 DIAGNOSIS — M545 Low back pain, unspecified: Secondary | ICD-10-CM | POA: Diagnosis not present

## 2021-03-17 DIAGNOSIS — Z79899 Other long term (current) drug therapy: Secondary | ICD-10-CM | POA: Diagnosis not present

## 2021-03-17 DIAGNOSIS — W182XXA Fall in (into) shower or empty bathtub, initial encounter: Secondary | ICD-10-CM | POA: Diagnosis not present

## 2021-03-17 DIAGNOSIS — M7918 Myalgia, other site: Secondary | ICD-10-CM | POA: Diagnosis not present

## 2021-03-17 DIAGNOSIS — S300XXA Contusion of lower back and pelvis, initial encounter: Secondary | ICD-10-CM | POA: Diagnosis not present

## 2021-03-17 DIAGNOSIS — R1110 Vomiting, unspecified: Secondary | ICD-10-CM | POA: Diagnosis not present

## 2021-03-17 DIAGNOSIS — R197 Diarrhea, unspecified: Secondary | ICD-10-CM | POA: Diagnosis not present

## 2021-06-05 ENCOUNTER — Ambulatory Visit (INDEPENDENT_AMBULATORY_CARE_PROVIDER_SITE_OTHER): Payer: BC Managed Care – PPO | Admitting: Internal Medicine

## 2021-06-05 ENCOUNTER — Encounter: Payer: Self-pay | Admitting: Internal Medicine

## 2021-06-05 VITALS — BP 118/70 | HR 85 | Temp 98.4°F | Ht 60.75 in | Wt 117.2 lb

## 2021-06-05 DIAGNOSIS — Z1159 Encounter for screening for other viral diseases: Secondary | ICD-10-CM

## 2021-06-05 DIAGNOSIS — N760 Acute vaginitis: Secondary | ICD-10-CM | POA: Insufficient documentation

## 2021-06-05 DIAGNOSIS — R7302 Impaired glucose tolerance (oral): Secondary | ICD-10-CM | POA: Diagnosis not present

## 2021-06-05 DIAGNOSIS — R55 Syncope and collapse: Secondary | ICD-10-CM | POA: Diagnosis not present

## 2021-06-05 DIAGNOSIS — E7800 Pure hypercholesterolemia, unspecified: Secondary | ICD-10-CM

## 2021-06-05 DIAGNOSIS — I10 Essential (primary) hypertension: Secondary | ICD-10-CM

## 2021-06-05 DIAGNOSIS — Z0001 Encounter for general adult medical examination with abnormal findings: Secondary | ICD-10-CM | POA: Diagnosis not present

## 2021-06-05 DIAGNOSIS — D259 Leiomyoma of uterus, unspecified: Secondary | ICD-10-CM | POA: Insufficient documentation

## 2021-06-05 DIAGNOSIS — Q963 Mosaicism, 45, X/46, XX or XY: Secondary | ICD-10-CM | POA: Insufficient documentation

## 2021-06-05 DIAGNOSIS — E538 Deficiency of other specified B group vitamins: Secondary | ICD-10-CM

## 2021-06-05 DIAGNOSIS — E559 Vitamin D deficiency, unspecified: Secondary | ICD-10-CM

## 2021-06-05 DIAGNOSIS — R1900 Intra-abdominal and pelvic swelling, mass and lump, unspecified site: Secondary | ICD-10-CM | POA: Insufficient documentation

## 2021-06-05 LAB — URINALYSIS, ROUTINE W REFLEX MICROSCOPIC
Bilirubin Urine: NEGATIVE
Hgb urine dipstick: NEGATIVE
Ketones, ur: NEGATIVE
Leukocytes,Ua: NEGATIVE
Nitrite: NEGATIVE
RBC / HPF: NONE SEEN (ref 0–?)
Specific Gravity, Urine: <=1.005 — AB (ref 1.000–1.030)
Total Protein, Urine: NEGATIVE
Urine Glucose: NEGATIVE
Urobilinogen, UA: 0.2 (ref 0.0–1.0)
WBC, UA: NONE SEEN (ref 0–?)
pH: 7.5 (ref 5.0–8.0)

## 2021-06-05 LAB — CBC WITH DIFFERENTIAL/PLATELET
Basophils Absolute: 0.1 10*3/uL (ref 0.0–0.1)
Basophils Relative: 1 % (ref 0.0–3.0)
Eosinophils Absolute: 0.2 10*3/uL (ref 0.0–0.7)
Eosinophils Relative: 3.4 % (ref 0.0–5.0)
HCT: 41.4 % (ref 36.0–46.0)
Hemoglobin: 14.1 g/dL (ref 12.0–15.0)
Lymphocytes Relative: 43 % (ref 12.0–46.0)
Lymphs Abs: 2.5 10*3/uL (ref 0.7–4.0)
MCHC: 34 g/dL (ref 30.0–36.0)
MCV: 93.9 fl (ref 78.0–100.0)
Monocytes Absolute: 0.7 10*3/uL (ref 0.1–1.0)
Monocytes Relative: 11.4 % (ref 3.0–12.0)
Neutro Abs: 2.4 10*3/uL (ref 1.4–7.7)
Neutrophils Relative %: 41.2 % — ABNORMAL LOW (ref 43.0–77.0)
Platelets: 293 10*3/uL (ref 150.0–400.0)
RBC: 4.41 Mil/uL (ref 3.87–5.11)
RDW: 12.6 % (ref 11.5–15.5)
WBC: 5.9 10*3/uL (ref 4.0–10.5)

## 2021-06-05 LAB — BASIC METABOLIC PANEL
BUN: 10 mg/dL (ref 6–23)
CO2: 24 mEq/L (ref 19–32)
Calcium: 10.2 mg/dL (ref 8.4–10.5)
Chloride: 105 mEq/L (ref 96–112)
Creatinine, Ser: 0.69 mg/dL (ref 0.40–1.20)
GFR: 100.65 mL/min (ref >60.00)
Glucose, Bld: 86 mg/dL (ref 70–99)
Potassium: 3.7 mEq/L (ref 3.5–5.1)
Sodium: 138 mEq/L (ref 135–145)

## 2021-06-05 LAB — HEPATIC FUNCTION PANEL
ALT: 17 U/L (ref 0–35)
AST: 19 U/L (ref 0–37)
Albumin: 4.8 g/dL (ref 3.5–5.2)
Alkaline Phosphatase: 54 U/L (ref 39–117)
Bilirubin, Direct: 0.1 mg/dL (ref 0.0–0.3)
Total Bilirubin: 1 mg/dL (ref 0.2–1.2)
Total Protein: 8.1 g/dL (ref 6.0–8.3)

## 2021-06-05 LAB — LIPID PANEL
Cholesterol: 190 mg/dL (ref 0–200)
HDL: 54.2 mg/dL (ref >39.00)
LDL Cholesterol: 117 mg/dL — ABNORMAL HIGH (ref 0–99)
NonHDL: 136.16
Total CHOL/HDL Ratio: 4
Triglycerides: 95 mg/dL (ref 0.0–149.0)
VLDL: 19 mg/dL (ref 0.0–40.0)

## 2021-06-05 LAB — HEMOGLOBIN A1C: Hgb A1c MFr Bld: 5.4 % (ref 4.6–6.5)

## 2021-06-05 LAB — VITAMIN D 25 HYDROXY (VIT D DEFICIENCY, FRACTURES): VITD: 34.05 ng/mL (ref 30.00–100.00)

## 2021-06-05 LAB — TSH: TSH: 1.39 u[IU]/mL (ref 0.35–5.50)

## 2021-06-05 MED ORDER — LOSARTAN POTASSIUM 50 MG PO TABS: 50.0000 mg | ORAL_TABLET | Freq: Every day | ORAL | 3 refills | Status: DC

## 2021-06-05 MED ORDER — ESCITALOPRAM OXALATE 10 MG PO TABS: 10.0000 mg | ORAL_TABLET | Freq: Every day | ORAL | 3 refills | Status: DC

## 2021-06-05 NOTE — Addendum Note (Signed)
Addended by: Boris Lown B on: 06/05/2021 02:35 PM   Modules accepted: Orders

## 2021-06-05 NOTE — Assessment & Plan Note (Signed)
Last vitamin D Lab Results  Component Value Date   VD25OH 19.43 (L) 05/15/2020   Low, to start oral replacement

## 2021-06-05 NOTE — Patient Instructions (Signed)

## 2021-06-05 NOTE — Progress Notes (Signed)
Patient ID: Lori Harmon, female   DOB: 02-16-1970, 51 y.o.   MRN: [id number]         Chief Complaint:: wellness exam and recent syncope, low vit d, hyperglycemia, hld, htn       HPI:  Lori Harmon is a 51 y.o. female here for wellness exam; had neg cologuard 2022 per GYN.  Due for hep c screen, decliines covid booster, shingrix, and colonoscopy                        Also had recent GI upset with syncope seen at ED with CT head, no arrythmia noted and symptoms resolved.  Pt denies chest pain, increased sob or doe, wheezing, orthopnea, PND, increased LE swelling, palpitations, dizziness or repeat syncope since that time.  Did have some soreness to the buttock and lower back where she fell backwards, very mild almost resolved today.   Pt denies polydipsia, polyuria, or new focal neuro s/s.    Pt denies fever, wt loss, night sweats, loss of appetite, or other constitutional symptoms  No other new complaints  Not taking vit D    Wt Readings from Last 3 Encounters:  06/05/21 117 lb 3.2 oz (53.2 kg)  05/16/20 121 lb (54.9 kg)  04/05/19 124 lb 3.2 oz (56.3 kg)   BP Readings from Last 3 Encounters:  06/05/21 118/70  05/16/20 118/70  04/05/19 140/82   Immunization History  Administered Date(s) Administered   Influenza Whole 10/05/2007   Influenza,inj,Quad PF,6+ Mos 10/09/2014   Influenza-Unspecified 10/18/2016, 10/14/2017   PFIZER(Purple Top)SARS-COV-2 Vaccination 03/10/2019, 03/31/2019, 11/28/2019   Td 02/28/2008   Tdap 05/16/2020   Health Maintenance Due  Topic Date Due   Hepatitis C Screening  Never done      Past Medical History:  Diagnosis Date   ALLERGIC RHINITIS 01/27/2007   ANXIETY 01/27/2007   DEPRESSION 01/27/2007   DIABETES MELLITUS, TYPE II 01/27/2007   HYPERLIPIDEMIA 01/27/2007   HYPERTENSION 01/27/2007   Palpitations    PCOS (polycystic ovarian syndrome)    SINUSITIS- ACUTE-NOS 01/30/2008   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 No pertinent surgical  history.  reports that she has never smoked. She has never used smokeless tobacco. She reports current alcohol use. She reports that she does not use drugs. family history includes Alcohol abuse in some other family members; Cancer in her father and other family members; Diabetes in her mother and another family member; Heart attack in her father and another family member; Hypertension in an other family member; Stroke in an other family member. Allergies  Allergen Reactions   Klonopin [Clonazepam]     fatigue   Current Outpatient Medications on File Prior to Visit  Medication Sig Dispense Refill   ipratropium (ATROVENT) 0.06 % nasal spray Place 2 sprays into both nostrils 4 (four) times daily. 15 mL 0   Norgestim-Eth Estrad Triphasic (ORTHO TRI-CYCLEN, 28, PO) Take 1 tablet by mouth daily.     No current facility-administered medications on file prior to visit.        ROS:  All others reviewed and negative.  Objective        PE:  BP 118/70 (BP Location: Right Arm, Patient Position: Sitting, Cuff Size: Normal)   Pulse 85   Temp 98.4 F (36.9 C) (Oral)   Ht 5' 0.75" (1.543 m)   Wt 117 lb 3.2 oz (53.2 kg)   SpO2 99%   BMI 22.33 kg/m  Constitutional: Pt appears in NAD               HENT: Head: NCAT.                Right Ear: External ear normal.                 Left Ear: External ear normal.                Eyes: . Pupils are equal, round, and reactive to light. Conjunctivae and EOM are normal               Nose: without d/c or deformity               Neck: Neck supple. Gross normal ROM               Cardiovascular: Normal rate and regular rhythm.                 Pulmonary/Chest: Effort normal and breath sounds without rales or wheezing.                Abd:  Soft, NT, ND, + BS, no organomegaly               Neurological: Pt is alert. At baseline orientation, motor grossly intact               Skin: Skin is warm. No rashes, no other new lesions, LE edema - none                Psychiatric: Pt behavior is normal without agitation   Micro: none  Cardiac tracings I have personally interpreted today:  none  Pertinent Radiological findings (summarize): none   Lab Results  Component Value Date   WBC 5.9 06/05/2021   HGB 14.1 06/05/2021   HCT 41.4 06/05/2021   PLT 293.0 06/05/2021   GLUCOSE 86 06/05/2021   CHOL 190 06/05/2021   TRIG 95.0 06/05/2021   HDL 54.20 06/05/2021   LDLDIRECT 106.0 02/09/2017   LDLCALC 117 (H) 06/05/2021   ALT 17 06/05/2021   AST 19 06/05/2021   NA 138 06/05/2021   K 3.7 06/05/2021   CL 105 06/05/2021   CREATININE 0.69 06/05/2021   BUN 10 06/05/2021   CO2 24 06/05/2021   TSH 1.39 06/05/2021   HGBA1C 5.4 06/05/2021   MICROALBUR 0.3 10/30/2010   Assessment/Plan:  Lori Harmon is a 51 y.o. White or Caucasian [1] female with  has a past medical history of ALLERGIC RHINITIS (01/27/2007), ANXIETY (01/27/2007), DEPRESSION (01/27/2007), DIABETES MELLITUS, TYPE II (01/27/2007), HYPERLIPIDEMIA (01/27/2007), HYPERTENSION (01/27/2007), Palpitations, PCOS (polycystic ovarian syndrome), SINUSITIS- ACUTE-NOS (01/30/2008), and TURNER'S SYNDROME (01/30/2008).  Vitamin D deficiency Last vitamin D Lab Results  Component Value Date   VD25OH 19.43 (L) 05/15/2020   Low, to start oral replacement   Encounter for well adult exam with abnormal findings Age and sex appropriate education and counseling updated with regular exercise and diet Referrals for preventative services - declines colonoscopy Immunizations addressed - declines covid booster, shingrix Smoking counseling  - none needed Evidence for depression or other mood disorder - none significant Most recent labs reviewed. I have personally reviewed and have noted: 1) the patient's medical and social history 2) The patient's current medications and supplements 3) The patient's height, weight, and BMI have been recorded in the chart   Syncope Likely vasovagal, d/w pt, exam  benign, for labs today, pt declines other such as card event monitor  or echo today  Impaired glucose tolerance Lab Results  Component Value Date   HGBA1C 5.4 06/05/2021   Stable, pt to continue current medical treatment  - diet, wt control, excercise   HLD (hyperlipidemia) Lab Results  Component Value Date   LDLCALC 117 (H) 06/05/2021   Mild uncontrolled, goal ldl < 100,, pt to continue low chol diet, declines statin today   Essential hypertension BP Readings from Last 3 Encounters:  06/05/21 118/70  05/16/20 118/70  04/05/19 140/82   Stable, pt to continue medical treatment losartan 50 mg qd  Followup: Return in about 1 year (around 06/06/2022).  Cathlean Cower, MD 06/07/2021 5:06 PM [facility]

## 2021-06-07 ENCOUNTER — Encounter: Payer: Self-pay | Admitting: Internal Medicine

## 2021-06-07 DIAGNOSIS — R55 Syncope and collapse: Secondary | ICD-10-CM | POA: Insufficient documentation

## 2021-06-07 NOTE — Assessment & Plan Note (Signed)
BP Readings from Last 3 Encounters:  06/05/21 118/70  05/16/20 118/70  04/05/19 140/82   Stable, pt to continue medical treatment losartan 50 mg qd

## 2021-06-07 NOTE — Assessment & Plan Note (Signed)
Likely vasovagal, d/w pt, exam benign, for labs today, pt declines other such as card event monitor or echo today

## 2021-06-07 NOTE — Assessment & Plan Note (Signed)
Age and sex appropriate education and counseling updated with regular exercise and diet Referrals for preventative services - declines colonoscopy Immunizations addressed - declines covid booster, shingrix Smoking counseling  - none needed Evidence for depression or other mood disorder - none significant Most recent labs reviewed. I have personally reviewed and have noted: 1) the patient's medical and social history 2) The patient's current medications and supplements 3) The patient's height, weight, and BMI have been recorded in the chart

## 2021-06-07 NOTE — Assessment & Plan Note (Signed)
Lab Results  Component Value Date   HGBA1C 5.4 06/05/2021   Stable, pt to continue current medical treatment  - diet, wt control, excercise  

## 2021-06-07 NOTE — Assessment & Plan Note (Signed)
Lab Results  Component Value Date   LDLCALC 117 (H) 06/05/2021   Mild uncontrolled, goal ldl < 100,, pt to continue low chol diet, declines statin today

## 2021-06-08 LAB — HEPATITIS C ANTIBODY
Hepatitis C Ab: NONREACTIVE
SIGNAL TO CUT-OFF: 0.21 (ref <1.00)

## 2021-07-16 DIAGNOSIS — Z1389 Encounter for screening for other disorder: Secondary | ICD-10-CM | POA: Diagnosis not present

## 2021-07-16 DIAGNOSIS — Z01419 Encounter for gynecological examination (general) (routine) without abnormal findings: Secondary | ICD-10-CM | POA: Diagnosis not present

## 2021-07-16 DIAGNOSIS — Z1231 Encounter for screening mammogram for malignant neoplasm of breast: Secondary | ICD-10-CM | POA: Diagnosis not present

## 2021-07-16 DIAGNOSIS — Z130 Encounter for screening for diseases of the blood and blood-forming organs and certain disorders involving the immune mechanism: Secondary | ICD-10-CM | POA: Diagnosis not present

## 2021-07-16 DIAGNOSIS — Q969 Turner's syndrome, unspecified: Secondary | ICD-10-CM | POA: Diagnosis not present

## 2021-09-24 ENCOUNTER — Encounter: Payer: Self-pay | Admitting: Internal Medicine

## 2021-09-25 MED ORDER — NIRMATRELVIR/RITONAVIR (PAXLOVID)TABLET: 3.0000 | ORAL_TABLET | Freq: Two times a day (BID) | ORAL | 0 refills | Status: AC

## 2022-05-20 ENCOUNTER — Other Ambulatory Visit: Payer: Self-pay | Admitting: Internal Medicine

## 2022-05-27 ENCOUNTER — Encounter: Payer: Self-pay | Admitting: Internal Medicine

## 2022-06-07 ENCOUNTER — Other Ambulatory Visit (INDEPENDENT_AMBULATORY_CARE_PROVIDER_SITE_OTHER): Payer: BC Managed Care – PPO

## 2022-06-07 DIAGNOSIS — R7302 Impaired glucose tolerance (oral): Secondary | ICD-10-CM | POA: Diagnosis not present

## 2022-06-07 DIAGNOSIS — E538 Deficiency of other specified B group vitamins: Secondary | ICD-10-CM | POA: Diagnosis not present

## 2022-06-07 DIAGNOSIS — E559 Vitamin D deficiency, unspecified: Secondary | ICD-10-CM | POA: Diagnosis not present

## 2022-06-07 DIAGNOSIS — E7800 Pure hypercholesterolemia, unspecified: Secondary | ICD-10-CM | POA: Diagnosis not present

## 2022-06-07 LAB — CBC WITH DIFFERENTIAL/PLATELET
Basophils Absolute: 0.1 10*3/uL (ref 0.0–0.1)
Basophils Relative: 0.8 % (ref 0.0–3.0)
Eosinophils Absolute: 0.4 10*3/uL (ref 0.0–0.7)
Eosinophils Relative: 5.6 % — ABNORMAL HIGH (ref 0.0–5.0)
HCT: 40.1 % (ref 36.0–46.0)
Hemoglobin: 13.5 g/dL (ref 12.0–15.0)
Lymphocytes Relative: 35 % (ref 12.0–46.0)
Lymphs Abs: 2.3 10*3/uL (ref 0.7–4.0)
MCHC: 33.7 g/dL (ref 30.0–36.0)
MCV: 94.4 fl (ref 78.0–100.0)
Monocytes Absolute: 0.8 10*3/uL (ref 0.1–1.0)
Monocytes Relative: 12.6 % — ABNORMAL HIGH (ref 3.0–12.0)
Neutro Abs: 3 10*3/uL (ref 1.4–7.7)
Neutrophils Relative %: 46 % (ref 43.0–77.0)
Platelets: 282 10*3/uL (ref 150.0–400.0)
RBC: 4.25 Mil/uL (ref 3.87–5.11)
RDW: 12.6 % (ref 11.5–15.5)
WBC: 6.5 10*3/uL (ref 4.0–10.5)

## 2022-06-07 LAB — URINALYSIS, ROUTINE W REFLEX MICROSCOPIC
Bilirubin Urine: NEGATIVE
Hgb urine dipstick: NEGATIVE
Ketones, ur: NEGATIVE
Leukocytes,Ua: NEGATIVE
Nitrite: NEGATIVE
RBC / HPF: NONE SEEN (ref 0–?)
Specific Gravity, Urine: <=1.005 — AB (ref 1.000–1.030)
Total Protein, Urine: NEGATIVE
Urine Glucose: NEGATIVE
Urobilinogen, UA: 0.2 (ref 0.0–1.0)
WBC, UA: NONE SEEN (ref 0–?)
pH: 7 (ref 5.0–8.0)

## 2022-06-07 LAB — BASIC METABOLIC PANEL
BUN: 9 mg/dL (ref 6–23)
CO2: 24 mEq/L (ref 19–32)
Calcium: 9.8 mg/dL (ref 8.4–10.5)
Chloride: 104 mEq/L (ref 96–112)
Creatinine, Ser: 0.72 mg/dL (ref 0.40–1.20)
GFR: 96.28 mL/min (ref >60.00)
Glucose, Bld: 83 mg/dL (ref 70–99)
Potassium: 3.9 mEq/L (ref 3.5–5.1)
Sodium: 137 mEq/L (ref 135–145)

## 2022-06-07 LAB — VITAMIN B12: Vitamin B-12: 487 pg/mL (ref 211–911)

## 2022-06-07 LAB — HEPATIC FUNCTION PANEL
ALT: 12 U/L (ref 0–35)
AST: 18 U/L (ref 0–37)
Albumin: 4.4 g/dL (ref 3.5–5.2)
Alkaline Phosphatase: 45 U/L (ref 39–117)
Bilirubin, Direct: 0.1 mg/dL (ref 0.0–0.3)
Total Bilirubin: 0.5 mg/dL (ref 0.2–1.2)
Total Protein: 7.2 g/dL (ref 6.0–8.3)

## 2022-06-07 LAB — LIPID PANEL
Cholesterol: 159 mg/dL (ref 0–200)
HDL: 50.7 mg/dL (ref >39.00)
LDL Cholesterol: 79 mg/dL (ref 0–99)
NonHDL: 107.8
Total CHOL/HDL Ratio: 3
Triglycerides: 144 mg/dL (ref 0.0–149.0)
VLDL: 28.8 mg/dL (ref 0.0–40.0)

## 2022-06-07 LAB — TSH: TSH: 4.59 u[IU]/mL (ref 0.35–5.50)

## 2022-06-07 LAB — HEMOGLOBIN A1C: Hgb A1c MFr Bld: 5.5 % (ref 4.6–6.5)

## 2022-06-07 LAB — VITAMIN D 25 HYDROXY (VIT D DEFICIENCY, FRACTURES): VITD: 37.75 ng/mL (ref 30.00–100.00)

## 2022-06-11 ENCOUNTER — Telehealth: Payer: Self-pay | Admitting: Internal Medicine

## 2022-06-11 ENCOUNTER — Encounter: Payer: BC Managed Care – PPO | Admitting: Internal Medicine

## 2022-06-11 MED ORDER — LOSARTAN POTASSIUM 50 MG PO TABS: 50.0000 mg | ORAL_TABLET | Freq: Every day | ORAL | 0 refills | Status: DC

## 2022-06-11 MED ORDER — ESCITALOPRAM OXALATE 10 MG PO TABS: 10.0000 mg | ORAL_TABLET | Freq: Every day | ORAL | 0 refills | Status: DC

## 2022-06-11 NOTE — Telephone Encounter (Signed)
Done erx 

## 2022-06-11 NOTE — Telephone Encounter (Signed)
Prescription Request  06/11/2022  LOV: Visit date not found  What is the name of the medication or equipment? Lexapro and losartan  Have you contacted your pharmacy to request a refill? Yes   Which pharmacy would you like this sent to?  [facility] [address] ([address]), [place] - [address] [place]) [place] [address] Phone: [phone] Fax: [phone]    Patient notified that their request is being sent to the clinical staff for review and that they should receive a response within 2 business days.   Please advise at Mobile [phone] (mobile)

## 2022-06-16 ENCOUNTER — Ambulatory Visit (INDEPENDENT_AMBULATORY_CARE_PROVIDER_SITE_OTHER): Payer: BC Managed Care – PPO | Admitting: Internal Medicine

## 2022-06-16 VITALS — BP 130/82 | HR 87 | Temp 97.9°F | Ht 60.75 in | Wt 116.4 lb

## 2022-06-16 DIAGNOSIS — E559 Vitamin D deficiency, unspecified: Secondary | ICD-10-CM

## 2022-06-16 DIAGNOSIS — R7302 Impaired glucose tolerance (oral): Secondary | ICD-10-CM

## 2022-06-16 DIAGNOSIS — Z0001 Encounter for general adult medical examination with abnormal findings: Secondary | ICD-10-CM

## 2022-06-16 DIAGNOSIS — Z1211 Encounter for screening for malignant neoplasm of colon: Secondary | ICD-10-CM

## 2022-06-16 DIAGNOSIS — M25562 Pain in left knee: Secondary | ICD-10-CM | POA: Diagnosis not present

## 2022-06-16 DIAGNOSIS — E7800 Pure hypercholesterolemia, unspecified: Secondary | ICD-10-CM | POA: Diagnosis not present

## 2022-06-16 DIAGNOSIS — I10 Essential (primary) hypertension: Secondary | ICD-10-CM

## 2022-06-16 DIAGNOSIS — E538 Deficiency of other specified B group vitamins: Secondary | ICD-10-CM

## 2022-06-16 NOTE — Progress Notes (Signed)
Patient ID: Lori Harmon, female   DOB: 06/12/1970, 52 y.o.   MRN: [id number]         Chief Complaint:: wellness exam and Annual Exam (Patient states she some issues with her knee, pain and " clicking sound" when she walks and bend her knee to go up the stairs )  , low vit d, hyperglycemia, htn, hld       HPI:  Lori Harmon is a 52 y.o. female here for wellness exam; has Gyn with mammogram in July 2024.  ; for shingrix at pharmacy, due for colonoscopy, o/w up to date                        Also has been somewhat dizzy at times recently with intermittent fasting.  Has 2 wks onset left knee medial pain and swelling, worse to walk up stairs with clicking sometimes loud.  Taking 1000 unit Vit  D.  Pt denies chest pain, increased sob or doe, wheezing, orthopnea, PND, increased LE swelling, palpitations, dizziness or syncope.   Pt denies polydipsia, polyuria, or new focal neuro s/s.    Pt denies fever, wt loss, night sweats, loss of appetite, or other constitutional symptoms     Wt Readings from Last 3 Encounters:  06/16/22 116 lb 6 oz (52.8 kg)  06/05/21 117 lb 3.2 oz (53.2 kg)  05/16/20 121 lb (54.9 kg)   BP Readings from Last 3 Encounters:  06/16/22 130/82  06/05/21 118/70  05/16/20 118/70   Immunization History  Administered Date(s) Administered   Influenza Whole 10/05/2007   Influenza,inj,Quad PF,6+ Mos 10/09/2014   Influenza-Unspecified 10/18/2016, 10/14/2017, 11/06/2021   PFIZER(Purple Top)SARS-COV-2 Vaccination 03/10/2019, 03/31/2019, 11/28/2019   Td 02/28/2008   Tdap 05/16/2020   Health Maintenance Due  Topic Date Due   Colonoscopy  Never done   Zoster Vaccines- Shingrix (1 of 2) Never done   PAP SMEAR-Modifier  08/05/2022      Past Medical History:  Diagnosis Date   ALLERGIC RHINITIS 01/27/2007   ANXIETY 01/27/2007   DEPRESSION 01/27/2007   DIABETES MELLITUS, TYPE II 01/27/2007   HYPERLIPIDEMIA 01/27/2007   HYPERTENSION 01/27/2007   Palpitations    PCOS  (polycystic ovarian syndrome)    SINUSITIS- ACUTE-NOS 01/30/2008   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 No pertinent surgical history.  reports that she has never smoked. She has never used smokeless tobacco. She reports current alcohol use. She reports that she does not use drugs. family history includes Alcohol abuse in some other family members; Cancer in her father and other family members; Diabetes in her mother and another family member; Heart attack in her father and another family member; Hypertension in an other family member; Stroke in an other family member. Allergies  Allergen Reactions   Klonopin [Clonazepam]     fatigue   Current Outpatient Medications on File Prior to Visit  Medication Sig Dispense Refill   escitalopram (LEXAPRO) 10 MG tablet Take 1 tablet (10 mg total) by mouth daily. 30 tablet 0   ipratropium (ATROVENT) 0.06 % nasal spray Place 2 sprays into both nostrils 4 (four) times daily. 15 mL 0   losartan (COZAAR) 50 MG tablet Take 1 tablet (50 mg total) by mouth daily. 30 tablet 0   Norgestim-Eth Estrad Triphasic (ORTHO TRI-CYCLEN, 28, PO) Take 1 tablet by mouth daily.     No current facility-administered medications on file prior to visit.        ROS:  All others reviewed and negative.  Objective        PE:  BP 130/82   Pulse 87   Temp 97.9 F (36.6 C) (Oral)   Ht 5' 0.75" (1.543 m)   Wt 116 lb 6 oz (52.8 kg)   SpO2 98%   BMI 22.17 kg/m                 Constitutional: Pt appears in NAD               HENT: Head: NCAT.                Right Ear: External ear normal.                 Left Ear: External ear normal.                Eyes: . Pupils are equal, round, and reactive to light. Conjunctivae and EOM are normal               Nose: without d/c or deformity               Neck: Neck supple. Gross normal ROM               Cardiovascular: Normal rate and regular rhythm.                 Pulmonary/Chest: Effort normal and breath sounds without  rales or wheezing.                Abd:  Soft, NT, ND, + BS, no organomegaly               Neurological: Pt is alert. At baseline orientation, motor grossly intact               Skin: Skin is warm. No rashes, no other new lesions, LE edema - none,   left knee with medial swelling and tender with mild tender, decreased ROM               Psychiatric: Pt behavior is normal without agitation   Micro: none  Cardiac tracings I have personally interpreted today:  none  Pertinent Radiological findings (summarize): none   Lab Results  Component Value Date   WBC 6.5 06/07/2022   HGB 13.5 06/07/2022   HCT 40.1 06/07/2022   PLT 282.0 06/07/2022   GLUCOSE 83 06/07/2022   CHOL 159 06/07/2022   TRIG 144.0 06/07/2022   HDL 50.70 06/07/2022   LDLDIRECT 106.0 02/09/2017   LDLCALC 79 06/07/2022   ALT 12 06/07/2022   AST 18 06/07/2022   NA 137 06/07/2022   K 3.9 06/07/2022   CL 104 06/07/2022   CREATININE 0.72 06/07/2022   BUN 9 06/07/2022   CO2 24 06/07/2022   TSH 4.59 06/07/2022   HGBA1C 5.5 06/07/2022   MICROALBUR 0.3 10/30/2010   Assessment/Plan:  Lori Harmon is a 52 y.o. White or Caucasian [1] female with  has a past medical history of ALLERGIC RHINITIS (01/27/2007), ANXIETY (01/27/2007), DEPRESSION (01/27/2007), DIABETES MELLITUS, TYPE II (01/27/2007), HYPERLIPIDEMIA (01/27/2007), HYPERTENSION (01/27/2007), Palpitations, PCOS (polycystic ovarian syndrome), SINUSITIS- ACUTE-NOS (01/30/2008), and TURNER'S SYNDROME (01/30/2008).  Encounter for well adult exam with abnormal findings Age and sex appropriate education and counseling updated with regular exercise and diet Referrals for preventative services - for colonoscopy, pt to call for pap and mammogram soon Immunizations addressed - for shingrix at pharmacy,  Smoking counseling  - none needed Evidence for depression  or other mood disorder - none significant Most recent labs reviewed. I have personally reviewed and have noted: 1) the  patient's medical and social history 2) The patient's current medications and supplements 3) The patient's height, weight, and BMI have been recorded in the chart   Impaired glucose tolerance Lab Results  Component Value Date   HGBA1C 5.5 06/07/2022   Stable, pt to continue current medical treatment  - diet, wt control   HLD (hyperlipidemia) Lab Results  Component Value Date   LDLCALC 79 06/07/2022   Stable, pt to continue low chol diet   Essential hypertension BP Readings from Last 3 Encounters:  06/16/22 130/82  06/05/21 118/70  05/16/20 118/70   Stable, pt to continue medical treatment losartan 50 qd   Vitamin D deficiency Last vitamin D Lab Results  Component Value Date   VD25OH 37.75 06/07/2022   Low, to start oral replacement   Acute pain of left knee Pt with high suspicion for medial meniscal tear - for sport med referral  Followup: Return in about 1 year (around 06/16/2023).  Oliver Barre, MD 06/18/2022 7:37 PM [facility]

## 2022-06-16 NOTE — Patient Instructions (Addendum)
Please have your Shingrix (shingles) shots done at your local pharmacy.  Ok to double up on the Vitamin D supplement  Please continue all other medications as before, and refills have been done if requested.  Please have the pharmacy call with any other refills you may need.  Please continue your efforts at being more active, low cholesterol diet, and weight control.  You are otherwise up to date with prevention measures today.  Please keep your appointments with your specialists as you may have planned - Gyn with mammogram next month  You will be contacted regarding the referral for: Sports medicine, and colonoscopy  Please make an Appointment to return for your 1 year visit, or sooner if needed, with Lab testing by Appointment as well, to be done about 3-5 days before at the FIRST FLOOR Lab (so this is for TWO appointments - please see the scheduling desk as you leave)

## 2022-06-18 ENCOUNTER — Encounter: Payer: Self-pay | Admitting: Internal Medicine

## 2022-06-18 DIAGNOSIS — M25562 Pain in left knee: Secondary | ICD-10-CM | POA: Insufficient documentation

## 2022-06-18 NOTE — Assessment & Plan Note (Signed)
Age and sex appropriate education and counseling updated with regular exercise and diet Referrals for preventative services - for colonoscopy, pt to call for pap and mammogram soon Immunizations addressed - for shingrix at pharmacy,  Smoking counseling  - none needed Evidence for depression or other mood disorder - none significant Most recent labs reviewed. I have personally reviewed and have noted: 1) the patient's medical and social history 2) The patient's current medications and supplements 3) The patient's height, weight, and BMI have been recorded in the chart

## 2022-06-18 NOTE — Assessment & Plan Note (Signed)
BP Readings from Last 3 Encounters:  06/16/22 130/82  06/05/21 118/70  05/16/20 118/70   Stable, pt to continue medical treatment losartan 50 qd

## 2022-06-18 NOTE — Assessment & Plan Note (Signed)
Lab Results  Component Value Date   LDLCALC 79 06/07/2022   Stable, pt to continue low chol diet

## 2022-06-18 NOTE — Assessment & Plan Note (Signed)
Pt with high suspicion for medial meniscal tear - for sport med referral

## 2022-06-18 NOTE — Assessment & Plan Note (Signed)
Lab Results  Component Value Date   HGBA1C 5.5 06/07/2022   Stable, pt to continue current medical treatment  - diet, wt control

## 2022-06-18 NOTE — Assessment & Plan Note (Signed)
Last vitamin D Lab Results  Component Value Date   VD25OH 37.75 06/07/2022   Low, to start oral replacement

## 2022-06-22 ENCOUNTER — Encounter: Payer: Self-pay | Admitting: Internal Medicine

## 2022-06-23 ENCOUNTER — Other Ambulatory Visit: Payer: Self-pay

## 2022-06-23 MED ORDER — ESCITALOPRAM OXALATE 10 MG PO TABS: 10.0000 mg | ORAL_TABLET | Freq: Every day | ORAL | 3 refills | Status: DC

## 2022-06-23 MED ORDER — LOSARTAN POTASSIUM 50 MG PO TABS: 50.0000 mg | ORAL_TABLET | Freq: Every day | ORAL | 3 refills | Status: DC

## 2022-06-29 ENCOUNTER — Ambulatory Visit: Payer: BC Managed Care – PPO | Admitting: Family Medicine

## 2022-06-29 ENCOUNTER — Ambulatory Visit (INDEPENDENT_AMBULATORY_CARE_PROVIDER_SITE_OTHER): Payer: BC Managed Care – PPO

## 2022-06-29 ENCOUNTER — Other Ambulatory Visit: Payer: Self-pay

## 2022-06-29 VITALS — BP 138/82 | HR 98 | Ht 60.0 in | Wt 117.8 lb

## 2022-06-29 DIAGNOSIS — M1712 Unilateral primary osteoarthritis, left knee: Secondary | ICD-10-CM | POA: Diagnosis not present

## 2022-06-29 DIAGNOSIS — M25562 Pain in left knee: Secondary | ICD-10-CM

## 2022-06-29 DIAGNOSIS — G8929 Other chronic pain: Secondary | ICD-10-CM

## 2022-06-29 NOTE — Progress Notes (Signed)
Rubin Payor, PhD, LAT, ATC acting as a scribe for Clementeen Graham, MD.  Lori Harmon is a 52 y.o. female who presents to [facility] today for L knee pain ongoing for about 2 months. No MOI. She notes a "clicking" sound w/ every step she takes. Pt reports not much pain, just the mechanical symptoms. She will running and had to stop running because of the discomfort and clicking sensation.  She exercises and does Pilates almost every day.  Her goal is to be able to run a 5K. She has a sedentary desk job.  L Knee swelling: yes Mechanical symptoms: yes Aggravates: running Treatments tried: decreased activity,   Pertinent review of systems: No fevers or chills  Relevant historical information: Hypertension.  Polymyalgia. Turner's syndrome  Exam:  BP 138/82   Pulse 98   Ht 5' (1.524 m)   Wt 117 lb 12.8 oz (53.4 kg)   SpO2 99%   BMI 23.01 kg/m  General: Well Developed, well nourished, and in no acute distress.   MSK: Left knee: Normal-appearing Normal knee motion. Some palpable retropatellar crepitations are present with knee extension. Intact strength. Stable ligamentous exam. Negative Murray's test.   Lab and Radiology Results  Diagnostic Limited MSK Ultrasound of: Left knee Mild joint effusion superior patellar space.  Normal patellar and quad tendons.  Relatively normal medial and lateral joint line. No Baker's cyst present. Impression: Mild joint effusion normal knee ultrasound otherwise.   X-ray images left knee obtained today personally and independently interpreted Mild DJD medial lateral patellofemoral compartments.  No acute fractures are present. Await formal radiology review   Assessment and Plan: 52 y.o. female with left knee pain discomfort and clicking sensation.  This could be a degenerative meniscus tear or could be patellofemoral chondromalacia.  She is a good candidate for trial of physical therapy.  Plan to refer  to PT to work especially on quadricep strengthening.  Check back in 6 to 8 weeks.  If not improved consider MRI or injection.   PDMP not reviewed this encounter. Orders Placed This Encounter  Procedures   [place] LIMITED JOINT SPACE STRUCTURES LOW LEFT(NO LINKED CHARGES)    Order Specific Question:   Reason for Exam (SYMPTOM  OR DIAGNOSIS REQUIRED)    Answer:   left knee pain    Order Specific Question:   Preferred imaging location?    Answer:   [facility] Knee AP/LAT W/[place] Left    Standing Status:   Future    Number of Occurrences:   1    Standing Expiration Date:   07/29/2022    Order Specific Question:   Reason for Exam (SYMPTOM  OR DIAGNOSIS REQUIRED)    Answer:   left knee pain    Order Specific Question:   Preferred imaging location?    Answer:   [facility]    Order Specific Question:   Is patient pregnant?    Answer:   No   Ambulatory referral to Physical Therapy    Referral Priority:   Routine    Referral Type:   Physical Medicine    Referral Reason:   Specialty Services Required    Requested Specialty:   Physical Therapy    Number of Visits Requested:   1   No orders of the defined types were placed in this encounter.    Discussed warning signs or symptoms. Please see discharge instructions. Patient expresses understanding.  The above documentation has been reviewed and is accurate and complete Clementeen Graham, M.D.

## 2022-06-29 NOTE — Patient Instructions (Addendum)
Thank you for coming in today.   Please get an Xray today before you leave.  Please use Voltaren gel (Generic Diclofenac Gel) up to 4x daily for pain as needed.  This is available over-the-counter as both the name brand Voltaren gel and the generic diclofenac gel.   Recheck in about 6-8 weeks. Idealy recheck after 6 weeks of PT especially if not better.   If not better next steps could be MRI or injection or ignore it.

## 2022-07-02 ENCOUNTER — Encounter: Payer: Self-pay | Admitting: Gastroenterology

## 2022-07-05 NOTE — Progress Notes (Signed)
Left hip x-ray shows mild arthritis

## 2022-07-13 ENCOUNTER — Encounter: Payer: Self-pay | Admitting: Physical Therapy

## 2022-07-13 ENCOUNTER — Ambulatory Visit (INDEPENDENT_AMBULATORY_CARE_PROVIDER_SITE_OTHER): Payer: BC Managed Care – PPO | Admitting: Physical Therapy

## 2022-07-13 DIAGNOSIS — M25562 Pain in left knee: Secondary | ICD-10-CM | POA: Diagnosis not present

## 2022-07-13 NOTE — Therapy (Signed)
OUTPATIENT PHYSICAL THERAPY LOWER EXTREMITY EVALUATION   Patient Name: Lori Harmon MRN: [id number] DOB:1970/07/29, 52 y.o., female Today's Date: 07/13/2022  END OF SESSION:  PT End of Session - 07/13/22 2108     Visit Number 1    Number of Visits 16    Date for PT Re-Evaluation 09/07/22    Authorization Type BCBS    PT Start Time 1220    PT Stop Time 1300    PT Time Calculation (min) 40 min    Activity Tolerance Patient tolerated treatment well    Behavior During Therapy [facility] for tasks assessed/performed             Past Medical History:  Diagnosis Date   ALLERGIC RHINITIS 01/27/2007   ANXIETY 01/27/2007   DEPRESSION 01/27/2007   DIABETES MELLITUS, TYPE II 01/27/2007   HYPERLIPIDEMIA 01/27/2007   HYPERTENSION 01/27/2007   Palpitations    PCOS (polycystic ovarian syndrome)    SINUSITIS- ACUTE-NOS 01/30/2008   TURNER'S SYNDROME 01/30/2008   History reviewed. No pertinent surgical history. 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 Has not had previous pain in knees. Stairs and increased walking , bending have been painful. Had has had to cut back on exercise, due to pain. Previously, she was jogging 40 min,  5 days w/k , and 15 min of pilates/day .Has had trouble in the past sustaining exercise due to increased pain in other areas. States some previous ankle rolls, weakness.  Sneakers: asics  or nike,    PERTINENT HISTORY: None   PAIN:  Are you having pain? Yes: NPRS scale: 3/10 Pain location: L knee/anterior Pain description: sore, clicking  Aggravating factors: stairs, squatting,  Relieving factors: none stated   PRECAUTIONS: None  WEIGHT BEARING RESTRICTIONS: No  FALLS:  Has patient fallen in last 6 months? No   PLOF: Independent  PATIENT GOALS:  Decreased pain   NEXT MD VISIT:   OBJECTIVE:   DIAGNOSTIC FINDINGS:    PATIENT SURVEYS:  FOTO:   69.4  COGNITION: Overall cognitive status: Within functional limits for tasks assessed     SENSATION: WFL  EDEMA: n/a   POSTURE:  Feet: normal/neutral alignment, back/hips: neutral. Standing: L knee in slight flexion  PALPATION: minimal pain to palpate today, mild tenderness at patella tendon. States normal pain in front of knee and around patella.    LOWER EXTREMITY ROM:  Hips: WFL Knees: WFL Ankles: mild limitation for DF on R.   LOWER EXTREMITY MMT:  Hip flex: 4/5,  abd: 4+/5 Knee flex: 4+5,  ext: 4/5 on L.   LOWER EXTREMITY SPECIAL TESTS:    FUNCTIONAL TESTS:  Stairs: mild pain at anterior knee.   GAIT: mild flexion in L knee.    TODAY'S TREATMENT:                                                                                                                              DATE:  07/13/2022  Therapeutic Exercise: Aerobic: Supine:  SLR x 10 bil;  quad sets x 10 bil;  bridging 2 x 10;  S/L: hip abd x 10 bil;  Seated:  LAQ x 15 bil;  Standing: Stretches:  Neuromuscular Re-education: Manual  Therapy: Therapeutic Activity: Self Care:    PATIENT EDUCATION:  Education details: PT POC, Exam findings, HEP Person educated: Patient Education method: Explanation, Demonstration, Tactile cues, Verbal cues, and Handouts Education comprehension: verbalized understanding, returned demonstration, verbal cues required, tactile cues required, and needs further education   HOME EXERCISE PROGRAM: Access Code: [facility] URL: [email or web address] Date: 07/13/2022 Prepared by: Sedalia Muta  Exercises - Supine Quadricep Sets  - 1 x daily - 2 sets - 10 reps - Straight Leg Raise  - 1 x daily - 2 sets - 10 reps - Sidelying Hip Abduction  - 1 x daily - 2 sets - 10 reps - Supine Bridge  - 1 x daily - 2 sets - 10 reps - Seated Knee Extension AROM  - 1 x daily - 2 sets - 10 reps  ASSESSMENT:  CLINICAL IMPRESSION: Patient presents with primary complaint of  increased pain in L knee. She has symptoms consistent with patella femoral syndrome. She has mild weakness in quad and hips, and lack of effective HEP for this diagnosis. Pt with decreased ability for full functional activities, stairs, exercise, and walk/run, and will benefit from skilled PT to improve.    OBJECTIVE IMPAIRMENTS: decreased activity tolerance, decreased mobility, decreased ROM, decreased strength, improper body mechanics, and pain.   ACTIVITY LIMITATIONS: bending, standing, squatting, stairs, and locomotion level  PARTICIPATION LIMITATIONS: cleaning, laundry, shopping, community activity, and yard work  PERSONAL FACTORS:  none  are also affecting patient's functional outcome.   REHAB POTENTIAL: Good  CLINICAL DECISION MAKING: Stable/uncomplicated  EVALUATION COMPLEXITY: Low   GOALS: Goals reviewed with patient? Yes  SHORT TERM GOALS: Target date: 07/27/2022   Pt to be independent with initial HEP  Goal status: INITIAL  2.  Pt to report decreased pain to 2/10 in L knee   Goal status:  INITIAL    LONG TERM GOALS: Target date: 09/07/2022  Pt to be independent with final HEP   Goal status: INITIAL  2.  Pt to report decreased pain in L knee to 0-1/10 with activity   Goal status: INITIAL  3.  Pt to demo ability for stair navigation without pain in knees , to improve ability for entry to work.   Goal status: INITIAL  4.  Pt  to report ability for jog/walk for at least 1 mile, without pain in knee, to improve ability for exercise.   Goal status: INITIAL  5.  Pt to demo strength and stability of L hip and knee to be [facility]. , to improve pain and function.   Goal status: INITIAL     PLAN:  PT FREQUENCY: 1-2x/week  PT DURATION: 8 weeks  PLANNED INTERVENTIONS: Therapeutic exercises, Therapeutic activity, Neuromuscular re-education, Patient/Family education, Self Care, Joint mobilization, Joint manipulation, Stair training, Orthotic/Fit training, DME instructions, Aquatic Therapy, Dry Needling, Electrical stimulation, Cryotherapy, Moist heat, Taping, Ultrasound, Ionotophoresis 4mg /ml Dexamethasone, Manual therapy,  Vasopneumatic device, Traction, Spinal manipulation, Spinal mobilization,Balance training, Gait training,   PLAN FOR NEXT SESSION:    Sedalia Muta, PT, DPT 9:10 PM  07/13/22

## 2022-07-22 ENCOUNTER — Ambulatory Visit: Payer: BC Managed Care – PPO | Admitting: Physical Therapy

## 2022-07-22 ENCOUNTER — Encounter: Payer: Self-pay | Admitting: Physical Therapy

## 2022-07-22 DIAGNOSIS — M25562 Pain in left knee: Secondary | ICD-10-CM

## 2022-07-22 NOTE — Therapy (Unsigned)
OUTPATIENT PHYSICAL THERAPY LOWER EXTREMITY TREATMENT   Patient Name: Lori Harmon MRN: [id number] DOB:October 01, 1970, 52 y.o., female Today's Date: 07/22/2022  END OF SESSION:  PT End of Session - 07/22/22 1606     Visit Number 2    Number of Visits 16    Date for PT Re-Evaluation 09/07/22    Authorization Type BCBS    PT Start Time 1600    PT Stop Time 1640    PT Time Calculation (min) 40 min    Activity Tolerance Patient tolerated treatment well    Behavior During Therapy [facility] for tasks assessed/performed              Past Medical History:  Diagnosis Date   ALLERGIC RHINITIS 01/27/2007   ANXIETY 01/27/2007   DEPRESSION 01/27/2007   DIABETES MELLITUS, TYPE II 01/27/2007   HYPERLIPIDEMIA 01/27/2007   HYPERTENSION 01/27/2007   Palpitations    PCOS (polycystic ovarian syndrome)    SINUSITIS- ACUTE-NOS 01/30/2008   TURNER'S SYNDROME 01/30/2008   History reviewed. No pertinent surgical history. 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   Eval: Pt states L knee, for about 2 months, No incident to report. Has not had previous pain in knees. Stairs and increased walking , bending have been painful. Had has had to cut back on exercise, due to pain. Previously, she was jogging 40 min,  5 days w/k , and 15 min of pilates/day .Has had trouble in the past sustaining exercise due to increased pain in other areas. States some previous ankle rolls, weakness.  Sneakers: asics  or nike,    PERTINENT HISTORY: None   PAIN:  Are you having pain? Yes: NPRS scale: 3/10 Pain location: L knee/anterior Pain description: sore, clicking  Aggravating factors: stairs, squatting,  Relieving factors: none stated   PRECAUTIONS: None  WEIGHT BEARING RESTRICTIONS: No  FALLS:  Has patient fallen in last 6 months? No   PLOF: Independent  PATIENT GOALS:  Decreased pain   NEXT MD VISIT:   OBJECTIVE:   DIAGNOSTIC FINDINGS:    PATIENT SURVEYS:  FOTO:   69.4  COGNITION: Overall cognitive status: Within functional limits for tasks assessed     SENSATION: WFL  EDEMA: n/a   POSTURE:  Feet: normal/neutral alignment, back/hips: neutral. Standing: L knee in slight flexion  PALPATION: minimal pain to palpate today, mild tenderness at patella tendon. States normal pain in front of knee and around patella.    LOWER EXTREMITY ROM:  Hips: WFL Knees: WFL Ankles: mild limitation  for DF on R.   LOWER EXTREMITY MMT:  Hip flex: 4/5,  abd: 4+/5 Knee flex: 4+5,  ext: 4/5 on L.   LOWER EXTREMITY SPECIAL TESTS:    FUNCTIONAL TESTS:  Stairs: mild pain at anterior knee.   GAIT: mild flexion in L knee.    TODAY'S TREATMENT:                                                                                                                              DATE:  07/22/2022 Therapeutic Exercise: Aerobic:  L1 x 7 min;  Supine:  SLR x 10 bil;  quad sets x 10 bil;   bridging with GTB at thighs  x 10;  SL bridge 2 x 5 bil;  S/L: hip  abd 2 x 10 bil;  Seated:  LAQ 2.5 lb x 10 bil;  Sit to stand, mat table 2 x 10;  mini squat x 10 with education on mechanics.  Standing:  Step ups 4 in x 12 bil no UE support; trial for 6 in- increased pain  Stretches:  Neuromuscular Re-education: Manual Therapy: Therapeutic Activity: Self Care:   PATIENT EDUCATION:  Education details: updated and reviewed HEP Person educated: Patient Education method: Explanation, Demonstration, Tactile cues, Verbal cues, and Handouts Education comprehension: verbalized understanding, returned demonstration, verbal cues required, tactile cues required, and needs further education   HOME EXERCISE PROGRAM: Access Code: [email or web address]  - Supine Quadricep Sets  - 1 x daily - 2 sets - 10 reps - Straight Leg Raise  - 1 x daily - 2 sets - 10 reps - Sidelying Hip Abduction  - 1 x daily - 2 sets - 10 reps - Supine Bridge  - 1 x daily - 2 sets - 10 reps - Seated Knee Extension AROM  - 1 x daily - 2 sets - 10 reps  ASSESSMENT:  CLINICAL IMPRESSION: Pt with good tolerance for exercises today. Little pain during activities, just a little soreness with step ups.  Reviewed HEP for mechanics. Will benefit from continued strength as tolerated.   Eval: Patient presents with primary complaint of  increased pain in L knee. She has symptoms consistent with patella femoral syndrome. She has mild weakness in quad and hips, and lack of effective HEP for this diagnosis. Pt with decreased ability for full functional activities, stairs, exercise, and walk/run, and will benefit from skilled PT to improve.    OBJECTIVE IMPAIRMENTS: decreased activity tolerance, decreased mobility, decreased ROM, decreased strength, improper body mechanics, and pain.   ACTIVITY LIMITATIONS: bending, standing, squatting, stairs, and locomotion level  PARTICIPATION LIMITATIONS: cleaning, laundry, shopping, community activity, and yard work  PERSONAL FACTORS:  none  are also affecting patient's  functional outcome.   REHAB POTENTIAL: Good  CLINICAL DECISION MAKING: Stable/uncomplicated  EVALUATION COMPLEXITY: Low   GOALS: Goals reviewed with patient? Yes  SHORT TERM GOALS: Target date: 07/27/2022   Pt to be independent with initial HEP  Goal status:  INITIAL  2.  Pt to report decreased pain to 2/10 in L knee   Goal status: INITIAL    LONG TERM GOALS: Target date: 09/07/2022  Pt to be independent with final HEP   Goal status: INITIAL  2.  Pt to report decreased pain in L knee to 0-1/10 with activity   Goal status: INITIAL  3.  Pt to demo ability for stair navigation without pain in knees , to improve ability for entry to work.   Goal status: INITIAL  4.  Pt to report ability for jog/walk for at least 1 mile, without pain in knee, to improve ability for exercise.   Goal status: INITIAL  5.  Pt to demo strength and stability of L hip and knee to be [facility]. , to improve pain and function.   Goal status: INITIAL    PLAN:  PT FREQUENCY: 1-2x/week  PT DURATION: 8 weeks  PLANNED INTERVENTIONS: Therapeutic exercises, Therapeutic activity, Neuromuscular re-education, Patient/Family education, Self Care, Joint mobilization, Joint manipulation, Stair training, Orthotic/Fit training, DME instructions, Aquatic Therapy, Dry Needling, Electrical stimulation, Cryotherapy, Moist heat, Taping, Ultrasound, Ionotophoresis 4mg /ml Dexamethasone, Manual therapy,  Vasopneumatic device, Traction, Spinal manipulation, Spinal mobilization,Balance training, Gait training,   PLAN FOR NEXT SESSION:    Sedalia Muta, PT, DPT 4:10 PM  07/22/22

## 2022-07-23 DIAGNOSIS — Z01419 Encounter for gynecological examination (general) (routine) without abnormal findings: Secondary | ICD-10-CM | POA: Diagnosis not present

## 2022-07-23 DIAGNOSIS — Q969 Turner's syndrome, unspecified: Secondary | ICD-10-CM | POA: Diagnosis not present

## 2022-07-23 DIAGNOSIS — N852 Hypertrophy of uterus: Secondary | ICD-10-CM | POA: Diagnosis not present

## 2022-07-23 DIAGNOSIS — Z1231 Encounter for screening mammogram for malignant neoplasm of breast: Secondary | ICD-10-CM | POA: Diagnosis not present

## 2022-07-27 ENCOUNTER — Encounter: Payer: Self-pay | Admitting: Physical Therapy

## 2022-07-27 ENCOUNTER — Ambulatory Visit: Payer: BC Managed Care – PPO | Admitting: Physical Therapy

## 2022-07-27 DIAGNOSIS — M25562 Pain in left knee: Secondary | ICD-10-CM | POA: Diagnosis not present

## 2022-07-27 NOTE — Therapy (Signed)
OUTPATIENT PHYSICAL THERAPY LOWER EXTREMITY TREATMENT   Patient Name: Lori Harmon MRN: [id number] DOB:May 23, 1970, 52 y.o., female Today's Date: 07/27/2022  END OF SESSION:  PT End of Session - 07/27/22 1213     Visit Number 3    Number of Visits 16    Date for PT Re-Evaluation 09/07/22    Authorization Type BCBS    PT Start Time 1215    PT Stop Time 1258    PT Time Calculation (min) 43 min    Activity Tolerance Patient tolerated treatment well    Behavior During Therapy [facility] for tasks assessed/performed              Past Medical History:  Diagnosis Date   ALLERGIC RHINITIS 01/27/2007   ANXIETY 01/27/2007   DEPRESSION 01/27/2007   DIABETES MELLITUS, TYPE II 01/27/2007   HYPERLIPIDEMIA 01/27/2007   HYPERTENSION 01/27/2007   Palpitations    PCOS (polycystic ovarian syndrome)    SINUSITIS- ACUTE-NOS 01/30/2008   TURNER'S SYNDROME 01/30/2008   History reviewed. No pertinent surgical history. 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 Still sore at anterior knee with stairs.   Eval: Pt states L knee, for about 2 months, No incident to report. Has not had previous pain in knees. Stairs and increased walking , bending have been painful. Had has had to cut back on exercise, due to pain. Previously, she was jogging 40 min,  5 days w/k , and 15 min of pilates/day .Has had trouble in the past sustaining exercise due to increased pain in other areas. States some previous ankle rolls, weakness.  Sneakers: asics  or nike,    PERTINENT HISTORY: None   PAIN:  Are you having pain? Yes: NPRS scale: 3/10 Pain location: L knee/anterior Pain description: sore, clicking  Aggravating factors: stairs, squatting,  Relieving factors: none stated   PRECAUTIONS: None  WEIGHT BEARING RESTRICTIONS: No  FALLS:  Has patient fallen in last 6 months? No   PLOF: Independent  PATIENT GOALS:  Decreased pain   NEXT MD VISIT:   OBJECTIVE:   DIAGNOSTIC FINDINGS:    PATIENT SURVEYS:  FOTO:   69.4  COGNITION: Overall cognitive status: Within functional limits for tasks assessed     SENSATION: WFL  EDEMA: n/a   POSTURE:  Feet: normal/neutral alignment, back/hips: neutral. Standing: L knee in slight flexion  PALPATION: minimal pain to palpate today, mild tenderness at patella tendon. States normal pain in front of knee and around patella.  LOWER EXTREMITY ROM:  Hips: WFL Knees: WFL Ankles: mild limitation for DF on R.   LOWER EXTREMITY MMT:  Hip flex: 4/5,  abd: 4+/5 Knee flex: 4+5,  ext: 4/5 on L.   LOWER EXTREMITY SPECIAL TESTS:    FUNCTIONAL TESTS:  Stairs: mild pain at anterior knee.   GAIT: mild flexion in L knee.    TODAY'S TREATMENT:                                                                                                                              DATE:  07/27/2022 Therapeutic Exercise: Aerobic:  L2 x 7 min;  Supine: Seated:  LAQ 2.5 lb x 10 bil;  Sit to stand, mat  table  x 10;    Standing:  Step ups 6 in x 10 bil no UE support (could back down to 4 in )  Squats at mat table, 10 lb 2 x 10;   HR 2 x 10 ;  Tandem stance 30 sec x 2 with head turns,  SLS 20 sec x 3 bil;  Walk/march, slow 10 ft x 4;  Stretches:  Other :  K-tape , 1 I strip for patella tendon decompression., education on use, wear time, and precautions.  Neuromuscular Re-education: Manual Therapy:    PATIENT EDUCATION:  Education details: updated and reviewed HEP Person educated: Patient Education method: Explanation, Demonstration, Tactile cues, Verbal cues, and Handouts Education comprehension: verbalized understanding, returned demonstration, verbal cues required, tactile cues required, and needs further education   HOME EXERCISE PROGRAM: Access Code: [email or web address]  - Supine Quadricep Sets  - 1 x daily - 2 sets - 10 reps - Straight Leg Raise  - 1 x daily - 2 sets - 10 reps - Sidelying Hip Abduction  - 1 x daily - 2 sets - 10 reps - Supine Bridge  - 1 x daily - 2 sets - 10 reps - Seated Knee Extension AROM  - 1 x daily - 2 sets - 10 reps  ASSESSMENT:  CLINICAL IMPRESSION: Pt with good tolerance for exercises today. Continues to have a little soreness with step ups, but was able to do 6 in today.  Trial for k-tape to patella tendon today. Will benefit from progressive strengthening.   Eval: Patient presents with primary complaint of  increased pain in L knee. She has symptoms consistent with patella femoral syndrome. She has mild weakness in quad and hips, and lack of effective HEP for this diagnosis. Pt with decreased ability for full functional activities, stairs, exercise, and walk/run, and will benefit from skilled PT to improve.    OBJECTIVE IMPAIRMENTS: decreased activity tolerance, decreased mobility, decreased ROM, decreased strength, improper body mechanics, and pain.   ACTIVITY LIMITATIONS: bending, standing, squatting, stairs, and locomotion level  PARTICIPATION  LIMITATIONS: cleaning, laundry, shopping, community activity, and yard work  PERSONAL FACTORS:  none  are also affecting patient's functional outcome.   REHAB POTENTIAL: Good  CLINICAL DECISION  MAKING: Stable/uncomplicated  EVALUATION COMPLEXITY: Low   GOALS: Goals reviewed with patient? Yes  SHORT TERM GOALS: Target date: 07/27/2022   Pt to be independent with initial HEP  Goal status: INITIAL  2.  Pt to report decreased pain to 2/10 in L knee   Goal status: INITIAL    LONG TERM GOALS: Target date: 09/07/2022  Pt to be independent with final HEP   Goal status: INITIAL  2.  Pt to report decreased pain in L knee to 0-1/10 with activity   Goal status: INITIAL  3.  Pt to demo ability for stair navigation without pain in knees , to improve ability for entry to work.   Goal status: INITIAL  4.  Pt to report ability for jog/walk for at least 1 mile, without pain in knee, to improve ability for exercise.   Goal status: INITIAL  5.  Pt to demo strength and stability of L hip and knee to be [facility]. , to improve pain and function.   Goal status: INITIAL    PLAN:  PT FREQUENCY: 1-2x/week  PT DURATION: 8 weeks  PLANNED INTERVENTIONS: Therapeutic exercises, Therapeutic activity, Neuromuscular re-education, Patient/Family education, Self Care, Joint mobilization, Joint manipulation, Stair training, Orthotic/Fit training, DME instructions, Aquatic Therapy, Dry Needling, Electrical stimulation, Cryotherapy, Moist heat, Taping, Ultrasound, Ionotophoresis 4mg /ml Dexamethasone, Manual therapy,  Vasopneumatic device, Traction, Spinal manipulation, Spinal mobilization,Balance training, Gait training,   PLAN FOR NEXT SESSION:    Sedalia Muta, PT, DPT 1:15 PM  07/27/22

## 2022-08-04 ENCOUNTER — Ambulatory Visit: Payer: BC Managed Care – PPO | Admitting: Physical Therapy

## 2022-08-04 ENCOUNTER — Ambulatory Visit (AMBULATORY_SURGERY_CENTER): Payer: BC Managed Care – PPO | Admitting: *Deleted

## 2022-08-04 ENCOUNTER — Encounter: Payer: Self-pay | Admitting: Physical Therapy

## 2022-08-04 VITALS — Ht 60.75 in | Wt 118.0 lb

## 2022-08-04 DIAGNOSIS — Z1211 Encounter for screening for malignant neoplasm of colon: Secondary | ICD-10-CM

## 2022-08-04 DIAGNOSIS — M25562 Pain in left knee: Secondary | ICD-10-CM

## 2022-08-04 MED ORDER — NA SULFATE-K SULFATE-MG SULF 17.5-3.13-1.6 GM/177ML PO SOLN
1.0000 | Freq: Once | ORAL | 0 refills | Status: AC
Start: 2022-08-04 — End: 2022-08-04

## 2022-08-04 NOTE — Therapy (Signed)
OUTPATIENT PHYSICAL THERAPY LOWER EXTREMITY TREATMENT   Patient Name: Lori Harmon MRN: [id number] DOB:October 06, 1970, 52 y.o., female Today's Date: 08/04/2022  END OF SESSION:  PT End of Session - 08/04/22 1216     Visit Number 4    Number of Visits 16    Date for PT Re-Evaluation 09/07/22    Authorization Type BCBS    PT Start Time 1218    PT Stop Time 1300    PT Time Calculation (min) 42 min    Activity Tolerance Patient tolerated treatment well    Behavior During Therapy [facility] for tasks assessed/performed              Past Medical History:  Diagnosis Date   ALLERGIC RHINITIS 01/27/2007   ANXIETY 01/27/2007   DEPRESSION 01/27/2007   DIABETES MELLITUS, TYPE II 01/27/2007   HYPERLIPIDEMIA 01/27/2007   HYPERTENSION 01/27/2007   Palpitations    PCOS (polycystic ovarian syndrome)    SINUSITIS- ACUTE-NOS 01/30/2008   TURNER'S SYNDROME 01/30/2008   History reviewed. No pertinent surgical history. 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   Pelvic mass 06/05/2021   Uterine leiomyoma 06/05/2021   Vitamin D deficiency 05/22/2020   Polymyalgia (HCC) 08/21/2018   Hypersomnolence 12/07/2016   Hematuria 08/13/2015   Encounter for well adult exam with abnormal findings 12/07/2010   BACK PAIN 04/30/2009   TURNER'S SYNDROME 01/30/2008   Turner's syndrome 01/30/2008   Impaired glucose tolerance 01/27/2007   HLD (hyperlipidemia) 01/27/2007   Anxiety state 01/27/2007   Depression 01/27/2007   Essential hypertension 01/27/2007   ALLERGIC RHINITIS 01/27/2007     PCP: Oliver Barre  REFERRING PROVIDER: Clementeen Graham   REFERRING DIAG: L knee pain   THERAPY DIAG:  Acute pain of left knee  Rationale for Evaluation and Treatment: Rehabilitation  ONSET DATE:    SUBJECTIVE:   SUBJECTIVE STATEMENT: Pt states less pain overall. Under the knee  cap feeling pretty good. Stairs also felt a bit better this week.   Eval: Pt states L knee, for about 2 months, No incident to report. Has not had previous pain in knees. Stairs and increased walking , bending have been painful. Had has had to cut back on exercise, due to pain. Previously, she was jogging 40 min,  5 days w/k , and 15 min of pilates/day .Has had trouble in the past sustaining exercise due to increased pain in other areas. States some previous ankle rolls, weakness.  Sneakers: asics  or nike,    PERTINENT HISTORY: None   PAIN:  Are you having pain? Yes: NPRS scale: 3/10 Pain location: L knee/anterior Pain description: sore, clicking  Aggravating factors: stairs, squatting,  Relieving factors: none stated   PRECAUTIONS: None  WEIGHT BEARING RESTRICTIONS: No  FALLS:  Has patient fallen in last 6 months? No   PLOF: Independent  PATIENT GOALS:  Decreased pain   NEXT MD VISIT:   OBJECTIVE:   DIAGNOSTIC FINDINGS:    PATIENT SURVEYS:  FOTO:   69.4  COGNITION: Overall cognitive status: Within functional limits for tasks assessed     SENSATION: WFL  EDEMA: n/a   POSTURE:  Feet: normal/neutral alignment, back/hips: neutral. Standing: L knee in slight flexion  PALPATION: minimal pain to palpate today, mild tenderness at patella tendon. States normal pain in front of knee and around patella.  LOWER EXTREMITY ROM:  Hips: WFL Knees: WFL Ankles: mild limitation for DF on R.   LOWER EXTREMITY MMT:  Hip flex: 4/5,  abd: 4+/5 Knee flex: 4+5,  ext: 4/5 on L.   LOWER EXTREMITY SPECIAL TESTS:    FUNCTIONAL TESTS:  Stairs: mild pain at anterior knee.   GAIT: mild flexion in L knee.    TODAY'S TREATMENT:                                                                                                                              DATE:   08/04/2022 Therapeutic Exercise: Aerobic:  L2 x 8 min;  Supine:  review of quad sets on L x 10;  Seated:  LAQ 3  lb x 10 bil;   Sit to stand, mat table  x 10;    Standing:  Squats at mat table, 10 lb 2 x 10;  lateral squat walk Gtb 20 ft x 4;  Stretches:  Other :  Neuromuscular Re-education: Manual Therapy: TPR and IASTM to L quad;  Mobilization for increasing L knee extension.    Previous: Step ups 6 in x 10 bil no UE support (could back down to 4 in )  K-tape     PATIENT EDUCATION:  Education details: updated and reviewed HEP Person educated: Patient Education method: Explanation, Demonstration, Tactile cues, Verbal cues, and Handouts Education comprehension: verbalized understanding, returned demonstration, verbal cues required, tactile cues required, and needs further education   HOME EXERCISE PROGRAM: Access Code: [email or web address]  - Supine Quadricep Sets  - 1 x daily - 2 sets - 10 reps - Straight Leg Raise  - 1 x daily - 2 sets - 10 reps - Sidelying Hip Abduction  - 1 x daily - 2 sets - 10 reps - Supine Bridge  - 1 x daily - 2 sets - 10 reps - Seated Knee Extension AROM  - 1 x daily - 2 sets - 10 reps  ASSESSMENT:  CLINICAL IMPRESSION: Pt with good tolerance for exercises today. Pt with improving pain. Will benefit from continued work on quad strength and stabilization. Slight improvement for knee extension passively in supine today. Will continue to work on this, as well as in standing position for optimal gait mechanics.   Eval: Patient presents with primary complaint of  increased pain in L knee. She has symptoms consistent with patella femoral syndrome. She has mild weakness in quad and hips, and lack of effective HEP for this diagnosis. Pt with decreased ability for full functional activities, stairs, exercise, and walk/run, and will benefit from skilled PT to improve.    OBJECTIVE IMPAIRMENTS: decreased activity tolerance, decreased mobility, decreased ROM, decreased strength, improper body mechanics, and pain.   ACTIVITY LIMITATIONS: bending, standing, squatting, stairs, and  locomotion level  PARTICIPATION LIMITATIONS: cleaning, laundry, shopping, community activity, and yard work  PERSONAL FACTORS:  none  are also affecting patient's functional outcome.   REHAB POTENTIAL: Good  CLINICAL  DECISION MAKING: Stable/uncomplicated  EVALUATION COMPLEXITY: Low   GOALS: Goals reviewed with patient? Yes  SHORT TERM GOALS: Target date: 07/27/2022   Pt to be independent with initial HEP  Goal status: INITIAL  2.  Pt to report decreased pain to 2/10 in L knee   Goal status: INITIAL    LONG TERM GOALS: Target date: 09/07/2022  Pt to be independent with final HEP   Goal status: INITIAL  2.  Pt to report decreased pain in L knee to 0-1/10 with activity   Goal status: INITIAL  3.  Pt to demo ability for stair navigation without pain in knees , to improve ability for entry to work.   Goal status: INITIAL  4.  Pt to report ability for jog/walk for at least 1 mile, without pain in knee, to improve ability for exercise.   Goal status: INITIAL  5.  Pt to demo strength and stability of L hip and knee to be [facility]. , to improve pain and function.   Goal status: INITIAL    PLAN:  PT FREQUENCY: 1-2x/week  PT DURATION: 8 weeks  PLANNED INTERVENTIONS: Therapeutic exercises, Therapeutic activity, Neuromuscular re-education, Patient/Family education, Self Care, Joint mobilization, Joint manipulation, Stair training, Orthotic/Fit training, DME instructions, Aquatic Therapy, Dry Needling, Electrical stimulation, Cryotherapy, Moist heat, Taping, Ultrasound, Ionotophoresis 4mg /ml Dexamethasone, Manual therapy,  Vasopneumatic device, Traction, Spinal manipulation, Spinal mobilization,Balance training, Gait training,   PLAN FOR NEXT SESSION: * check hip ext on L, terminal stance, gait , possible Dry needling for quad/lateral quad  as needed.    Sedalia Muta, PT, DPT 2:05 PM  08/04/22

## 2022-08-04 NOTE — Progress Notes (Signed)
Pt's name and DOB verified at the beginning of the pre-visit.  Pt denies any difficulty with ambulating,sitting, laying down or rolling side to side Gave both [facility] main # and MD on call # prior to instructions.  No egg or soy allergy known to patient  Patient denies ever being intubated Pt has no issues moving head neck or swallowing No FH of Malignant Hyperthermia Pt is not on diet pills Pt is not on home 02  Pt is not on blood thinners  Pt denies issues with constipation  Pt is not on dialysis Pt denise any abnormal heart rhythms  Pt denies any upcoming cardiac testing Pt encouraged to use to use Singlecare or Goodrx to reduce cost  Patient's chart reviewed by Lori Harmon CNRA prior to pre-visit and patient appropriate for the [facility].  Pre-visit completed and red dot placed by patient's name on their procedure day (on provider's schedule).  . Visit by phone Pt states weight is 118 lb Instructed pt why it is important to and  to call if they have any changes in health or new medications. Directed them to the # given and on instructions.   Pt states they will.  Instructions reviewed with pt and pt states understanding. Instructed to review again prior to procedure. Pt states they will.  Instructions sent by mail with coupon and by my chart

## 2022-08-05 ENCOUNTER — Encounter: Payer: Self-pay | Admitting: Gastroenterology

## 2022-08-12 ENCOUNTER — Encounter: Payer: BC Managed Care – PPO | Admitting: Physical Therapy

## 2022-08-18 ENCOUNTER — Ambulatory Visit (INDEPENDENT_AMBULATORY_CARE_PROVIDER_SITE_OTHER): Payer: BC Managed Care – PPO | Admitting: Physical Therapy

## 2022-08-18 ENCOUNTER — Encounter: Payer: Self-pay | Admitting: Physical Therapy

## 2022-08-18 DIAGNOSIS — M25562 Pain in left knee: Secondary | ICD-10-CM | POA: Diagnosis not present

## 2022-08-18 NOTE — Therapy (Signed)
OUTPATIENT PHYSICAL THERAPY LOWER EXTREMITY TREATMENT   Patient Name: ALEISHA LONGWELL MRN: [id number] DOB:[birth year], 52 y.o., female Today's Date: 08/18/2022  END OF SESSION:  PT End of Session - 08/18/22 1229     Visit Number 5    Number of Visits 16    Date for PT Re-Evaluation 09/07/22    Authorization Type BCBS    PT Start Time 1218    PT Stop Time 1257    PT Time Calculation (min) 39 min    Activity Tolerance Patient tolerated treatment well    Behavior During Therapy [facility] for tasks assessed/performed               Past Medical History:  Diagnosis Date   ALLERGIC RHINITIS 01/27/2007   ANXIETY 01/27/2007   Arthritis    Mild L hip   DEPRESSION 01/27/2007   DIABETES MELLITUS, TYPE II 01/27/2007   HYPERLIPIDEMIA 01/27/2007   HYPERTENSION 01/27/2007   Palpitations    PCOS (polycystic ovarian syndrome)    SINUSITIS- ACUTE-NOS 01/30/2008   TURNER'S SYNDROME 01/30/2008   History reviewed. No pertinent surgical history. 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   Pelvic mass 06/05/2021   Uterine leiomyoma 06/05/2021   Vitamin D deficiency 05/22/2020   Polymyalgia (HCC) 08/21/2018   Hypersomnolence 12/07/2016   Hematuria 08/13/2015   Encounter for well adult exam with abnormal findings 12/07/2010   BACK PAIN 04/30/2009   TURNER'S SYNDROME 01/30/2008   Turner's syndrome 01/30/2008   Impaired glucose tolerance 01/27/2007   HLD (hyperlipidemia) 01/27/2007   Anxiety state 01/27/2007   Depression 01/27/2007   Essential hypertension 01/27/2007   ALLERGIC RHINITIS 01/27/2007     PCP: Oliver Barre  REFERRING PROVIDER: Clementeen Graham   REFERRING DIAG: L knee pain   THERAPY DIAG:  Acute pain of left knee  Rationale for Evaluation and Treatment: Rehabilitation  ONSET DATE:    SUBJECTIVE:   SUBJECTIVE  STATEMENT:  Feeling OK, knee has been making some noise. I've been trying to get back on my stationary bike more so that I'm doing something and it has been making some unusual noises. Still working on straightening my knee with Lauren, I do have one leg longer than the other , just a hair longer, but wondering if that is leading to some of my knee issues. Tried an insert one day but it really didn't work well, caused a lot of pain so I took it out. Pain is usually after I exercise, or after doing stairs at work.   Eval: Pt states L knee, for about 2 months, No incident to report. Has not had previous pain in knees. Stairs and increased walking , bending have been painful. Had has had to cut back on exercise, due to pain. Previously, she was jogging 40 min,  5 days w/k , and 15 min of pilates/day .Has had trouble in the past sustaining exercise due to increased pain in other areas. States some previous ankle rolls, weakness.  Sneakers: asics  or nike,    PERTINENT HISTORY: None   PAIN:  Are you having pain? 0/10 today  PRECAUTIONS: None  WEIGHT BEARING RESTRICTIONS: No  FALLS:  Has patient fallen in last 6 months? No   PLOF: Independent  PATIENT GOALS:  Decreased pain   NEXT MD VISIT:   OBJECTIVE:   DIAGNOSTIC FINDINGS:  PATIENT SURVEYS:  FOTO:   69.4  COGNITION: Overall cognitive status: Within functional limits for tasks assessed     SENSATION: WFL  EDEMA: n/a   POSTURE:  Feet: normal/neutral alignment, back/hips: neutral. Standing: L knee in slight flexion  PALPATION: minimal pain to palpate today, mild tenderness at patella tendon. States normal pain in front of knee and around patella.    LOWER EXTREMITY ROM:  Hips: WFL Knees: WFL Ankles: mild limitation for DF on R.   LOWER EXTREMITY MMT:  Hip flex: 4/5,  abd: 4+/5 Knee flex: 4+5,  ext: 4/5 on L.   LOWER EXTREMITY SPECIAL TESTS:    FUNCTIONAL TESTS:  Stairs: mild pain at anterior knee.   GAIT:  mild flexion in L knee.    TODAY'S TREATMENT:                                                                                                                              DATE:   08/18/2022  Glutes no better 3+/5 B from prone   Therapeutic Exercise: Aerobic:   Supine:  bridges +ABD into red TB x12, sidelying hip ABD x12 B red TB, walking bridges x12, SLRs 2.5# x12 B, SLRs + ER 2.5# x12 B Seated:   Standing:  forward step ups 6 inch step x12 B, standing hip hikes x12 B, forward step downs 4 inch step x10 B (half height), standing hip hikes + ABD x10 B, intro to DL form Z61 (tends to shift away from L LE at end range), quadruped bear crawl holds 5x20 seconds  Stretches:  Other :  Neuromuscular Re-education: Manual Therapy:        Previous  Therapeutic Exercise: Aerobic:  L2 x 8 min;  Supine:  review of quad sets on L x 10;  Seated:  LAQ 3 lb x 10 bil;   Sit to stand, mat table  x 10;    Standing:  Squats at mat table, 10 lb 2 x 10;  lateral squat walk Gtb 20 ft x 4;  Stretches:  Other :  Neuromuscular Re-education: Manual Therapy: TPR and IASTM to L quad;  Mobilization for increasing L knee extension.    Previous: Step ups 6 in x 10 bil no UE support (could back down to 4 in )  K-tape    PATIENT EDUCATION:  Education details: updated and reviewed HEP Person educated: Patient Education method: Explanation, Demonstration, Tactile cues, Verbal cues, and Handouts Education comprehension: verbalized understanding, returned demonstration, verbal cues required, tactile cues required, and needs further education   HOME EXERCISE PROGRAM: Access Code: [email or web address]  - Supine Quadricep Sets  - 1 x daily - 2 sets - 10 reps - Straight Leg Raise  - 1 x daily - 2 sets - 10 reps - Sidelying Hip Abduction  - 1 x daily - 2 sets - 10 reps - Supine Bridge  - 1 x daily - 2 sets - 10 reps - Seated Knee  Extension AROM  - 1 x daily - 2 sets - 10 reps  ASSESSMENT:  CLINICAL  IMPRESSION:  Tevin arrives today doing OK, still having ongoing knee joint sounds and discomfort. Noted significant isolated glute weakness, worked in a bit more glute strengthening into today's session along with ongoing quad strength progressions. Tolerated session well with no complaints.   Eval: Patient presents with primary complaint of  increased pain in L knee. She has symptoms consistent with patella femoral syndrome. She has mild weakness in quad and hips, and lack of effective HEP for this diagnosis. Pt with decreased ability for full functional activities, stairs, exercise, and walk/run, and will benefit from skilled PT to improve.    OBJECTIVE IMPAIRMENTS: decreased activity tolerance, decreased mobility, decreased ROM, decreased strength, improper body mechanics, and pain.   ACTIVITY LIMITATIONS: bending, standing, squatting, stairs, and locomotion level  PARTICIPATION LIMITATIONS: cleaning, laundry, shopping, community activity, and yard work  PERSONAL FACTORS:  none  are also affecting patient's functional outcome.   REHAB POTENTIAL: Good  CLINICAL DECISION MAKING: Stable/uncomplicated  EVALUATION COMPLEXITY: Low   GOALS: Goals reviewed with patient? Yes  SHORT TERM GOALS: Target date: 07/27/2022   Pt to be independent with initial HEP  Goal status: INITIAL  2.  Pt to report decreased pain to 2/10 in L knee   Goal status: INITIAL    LONG TERM GOALS: Target date: 09/07/2022  Pt to be independent with final HEP   Goal status: INITIAL  2.  Pt to report decreased pain in L knee to 0-1/10 with activity   Goal status: INITIAL  3.  Pt to demo ability for stair navigation without pain in knees , to improve ability for entry to work.   Goal status: INITIAL  4.  Pt to report ability for jog/walk for at least 1 mile, without pain in knee, to improve ability for exercise.   Goal status: INITIAL  5.  Pt to demo strength and stability of L hip and knee to be  [facility]. , to improve pain and function.   Goal status: INITIAL    PLAN:  PT FREQUENCY: 1-2x/week  PT DURATION: 8 weeks  PLANNED INTERVENTIONS: Therapeutic exercises, Therapeutic activity, Neuromuscular re-education, Patient/Family education, Self Care, Joint mobilization, Joint manipulation, Stair training, Orthotic/Fit training, DME instructions, Aquatic Therapy, Dry Needling, Electrical stimulation, Cryotherapy, Moist heat, Taping, Ultrasound, Ionotophoresis 4mg /ml Dexamethasone, Manual therapy,  Vasopneumatic device, Traction, Spinal manipulation, Spinal mobilization,Balance training, Gait training,   PLAN FOR NEXT SESSION: * check hip ext on L, terminal stance, gait , possible Dry needling for quad/lateral quad  as needed. Needs more isolated glute and lower back strengthening, work on DL form    Nedra Hai, PT, DPT 08/18/22 12:57 PM

## 2022-08-19 ENCOUNTER — Encounter: Payer: Self-pay | Admitting: Gastroenterology

## 2022-08-19 ENCOUNTER — Ambulatory Visit: Payer: BC Managed Care – PPO | Admitting: Gastroenterology

## 2022-08-19 VITALS — BP 107/76 | HR 77 | Temp 96.6°F | Resp 18 | Ht 60.75 in | Wt 118.0 lb

## 2022-08-19 DIAGNOSIS — D123 Benign neoplasm of transverse colon: Secondary | ICD-10-CM

## 2022-08-19 DIAGNOSIS — Z1211 Encounter for screening for malignant neoplasm of colon: Secondary | ICD-10-CM

## 2022-08-19 MED ORDER — SODIUM CHLORIDE 0.9 % IV SOLN: 500.0000 mL | Freq: Once | INTRAVENOUS | Status: DC

## 2022-08-19 NOTE — Op Note (Signed)
[facility] Patient Name: Lori Harmon Procedure Date: 08/19/2022 7:29 AM MRN: [id number] Endoscopist: Lorin Picket E. Tomasa Rand , MD, [id number] Age: 52 Referring MD:  Date of Birth: 1970/12/10 Gender: Female Account #: [id number] Procedure:                Colonoscopy Indications:              Screening for colorectal malignant neoplasm, This                            is the patient's first colonoscopy Medicines:                Monitored Anesthesia Care Procedure:                Pre-Anesthesia Assessment:                           - Prior to the procedure, a History and Physical                            was performed, and patient medications and                            allergies were reviewed. The patient's tolerance of                            previous anesthesia was also reviewed. The risks                            and benefits of the procedure and the sedation                            options and risks were discussed with the patient.                            All questions were answered, and informed consent                            was obtained. Prior Anticoagulants: The patient has                            taken no anticoagulant or antiplatelet agents. ASA                            Grade Assessment: II - A patient with mild systemic                            disease. After reviewing the risks and benefits,                            the patient was deemed in satisfactory condition to                            undergo the procedure.  After obtaining informed consent, the colonoscope                            was passed under direct vision. Throughout the                            procedure, the patient's blood pressure, pulse, and                            oxygen saturations were monitored continuously. The                            CF [id number] #[id number] was introduced through the anus                            and advanced  to the the terminal ileum, with                            identification of the appendiceal orifice and IC                            valve. The colonoscopy was performed without                            difficulty. The patient tolerated the procedure                            well. The quality of the bowel preparation was                            good. The terminal ileum, ileocecal valve,                            appendiceal orifice, and rectum were photographed.                            The bowel preparation used was SUPREP via split                            dose instruction. Scope In: 8:06:11 AM Scope Out: 8:20:54 AM Scope Withdrawal Time: 0 hours 9 minutes 33 seconds  Total Procedure Duration: 0 hours 14 minutes 43 seconds  Findings:                 The perianal and digital rectal examinations were                            normal. Pertinent negatives include normal                            sphincter tone and no palpable rectal lesions.                           A 4 mm polyp was found in the proximal transverse  colon. The polyp was sessile. The polyp was removed                            with a cold snare. Resection and retrieval were                            complete. Estimated blood loss was minimal.                           The exam was otherwise normal throughout the                            examined colon.                           The terminal ileum appeared normal.                           Non-bleeding internal hemorrhoids were found during                            retroflexion. The hemorrhoids were Grade I                            (internal hemorrhoids that do not prolapse).                           No additional abnormalities were found on                            retroflexion. Complications:            No immediate complications. Estimated Blood Loss:     Estimated blood loss was minimal. Impression:               -  One 4 mm polyp in the proximal transverse colon,                            removed with a cold snare. Resected and retrieved.                           - The examined portion of the ileum was normal.                           - Non-bleeding internal hemorrhoids. Recommendation:           - Patient has a contact number available for                            emergencies. The signs and symptoms of potential                            delayed complications were discussed with the                            patient. Return to normal activities tomorrow.  Written discharge instructions were provided to the                            patient.                           - Resume previous diet.                           - Continue present medications.                           - Await pathology results.                           - Repeat colonoscopy (date not yet determined) for                            surveillance based on pathology results. Daivion Pape E. Tomasa Rand, MD 08/19/2022 8:26:42 AM This report has been signed electronically.

## 2022-08-19 NOTE — Progress Notes (Signed)
Called to room to assist during endoscopic procedure.  Patient ID and intended procedure confirmed with present staff. Received instructions for my participation in the procedure from the performing physician.  

## 2022-08-19 NOTE — Progress Notes (Signed)
[facility] History and Physical   Primary Care Physician:  Corwin Levins, MD   Reason for Procedure:   Colon cancer screening  Plan:    Colonoscopy     HPI: Lori Harmon is a 52 y.o. female undergoing initial average risk screening colonoscopy.  She has no family history of colon cancer and no chronic GI symptoms.    Past Medical History:  Diagnosis Date   ALLERGIC RHINITIS 01/27/2007   ANXIETY 01/27/2007   Arthritis    Mild L hip   DEPRESSION 01/27/2007   DIABETES MELLITUS, TYPE II 01/27/2007   HYPERLIPIDEMIA 01/27/2007   HYPERTENSION 01/27/2007   Palpitations    PCOS (polycystic ovarian syndrome)    SINUSITIS- ACUTE-NOS 01/30/2008   TURNER'S SYNDROME 01/30/2008    Past Surgical History:  Procedure Laterality Date   COLONOSCOPY      Prior to Admission medications   Medication Sig Start Date End Date Taking? Authorizing Provider  Cholecalciferol (VITAMIN D3) 50 MCG (2000 UT) capsule Take 2,000 Units by mouth daily.   Yes [provider]  escitalopram (LEXAPRO) 10 MG tablet Take 1 tablet (10 mg total) by mouth daily. 06/23/22  Yes Corwin Levins, MD  losartan (COZAAR) 50 MG tablet Take 1 tablet (50 mg total) by mouth daily. 06/23/22  Yes Corwin Levins, MD  Multiple Vitamin (MULTIVITAMIN WITH MINERALS) TABS tablet Take 1 tablet by mouth daily.   Yes [provider]  Omega-3 Fatty Acids (FISH OIL) 300 MG CAPS Take by mouth.   Yes [provider]  OVER THE COUNTER MEDICATION Allergy medication BID   Yes [provider]  ipratropium (ATROVENT) 0.06 % nasal spray Place 2 sprays into both nostrils 4 (four) times daily. Patient not taking: Reported on 08/04/2022 01/03/18   Ofilia Neas, PA-C  Norgestim-Eth Estrad Triphasic (ORTHO TRI-CYCLEN, 28, PO) Take 1 tablet by mouth daily.    [provider]    Current Outpatient Medications  Medication Sig Dispense Refill   Cholecalciferol (VITAMIN D3) 50 MCG (2000 UT)  capsule Take 2,000 Units by mouth daily.     escitalopram (LEXAPRO) 10 MG tablet Take 1 tablet (10 mg total) by mouth daily. 90 tablet 3   losartan (COZAAR) 50 MG tablet Take 1 tablet (50 mg total) by mouth daily. 90 tablet 3   Multiple Vitamin (MULTIVITAMIN WITH MINERALS) TABS tablet Take 1 tablet by mouth daily.     Omega-3 Fatty Acids (FISH OIL) 300 MG CAPS Take by mouth.     OVER THE COUNTER MEDICATION Allergy medication BID     ipratropium (ATROVENT) 0.06 % nasal spray Place 2 sprays into both nostrils 4 (four) times daily. (Patient not taking: Reported on 08/04/2022) 15 mL 0   Norgestim-Eth Estrad Triphasic (ORTHO TRI-CYCLEN, 28, PO) Take 1 tablet by mouth daily.     Current Facility-Administered Medications  Medication Dose Route Frequency Provider Last Rate Last Admin   0.9 %  sodium chloride infusion  500 mL Intravenous Once Jenel Lucks, MD        Allergies as of 08/19/2022 - Review Complete 08/19/2022  Allergen Reaction Noted   Flavoring agent Diarrhea 01/04/2022   Klonopin [clonazepam]  05/27/2016   Latex Other (See Comments) 11/28/2013    Family History  Problem Relation Age of Onset   Diabetes Mother    Cancer Father        Skin Cancers   Heart attack Father    Diabetes Other  Grandparents   Hypertension Other        FH of   Cancer Other        Aunt-Breast Cancer   Heart attack Other        Aunt-MI in her 4's   Alcohol abuse Other        Uncle   Alcohol abuse Other        Grandmother   Cancer Other        Grandmother-Lung Cancer   Stroke Other    Colon cancer Neg Hx    Colon polyps Neg Hx    Esophageal cancer Neg Hx    Stomach cancer Neg Hx    Rectal cancer Neg Hx     Social History   Socioeconomic History   Marital status: Married    Spouse name: Not on file   Number of children: 0   Years of education: Not on file   Highest education level: Not on file  Occupational History   Occupation: Chief Financial Officer: [employer]   Tobacco Use   Smoking status: Never   Smokeless tobacco: Never  Vaping Use   Vaping status: Never Used  Substance and Sexual Activity   Alcohol use: Yes   Drug use: No   Sexual activity: Not on file  Other Topics Concern   Not on file  Social History Narrative   Not on file   Social Determinants of Health   Financial Resource Strain: Not on file  Food Insecurity: Not on file  Transportation Needs: Not on file  Physical Activity: Not on file  Stress: Not on file  Social Connections: Unknown (05/19/2021)   Received from [facility]   Social Network    Social Network: Not on file  Intimate Partner Violence: Unknown (04/10/2021)   Received from [facility]   HITS    Physically Hurt: Not on file    Insult or Talk Down To: Not on file    Threaten Physical Harm: Not on file    Scream or Curse: Not on file    Review of Systems:  All other review of systems negative except as mentioned in the HPI.  Physical Exam: Vital signs BP (!) 143/77   Pulse (!) 106   Temp (!) 96.6 F (35.9 C) (Temporal)   Ht 5' 0.75" (1.543 m)   Wt 118 lb (53.5 kg)   SpO2 100%   BMI 22.48 kg/m   General:   Alert,  Well-developed, well-nourished, pleasant and cooperative in NAD Airway:  Mallampati 1 Lungs:  Clear throughout to auscultation.   Heart:  Regular rate and rhythm; no murmurs, clicks, rubs,  or gallops. Abdomen:  Soft, nontender and nondistended. Normal bowel sounds.   Neuro/Psych:  Normal mood and affect. A and O x 3   Abdul Beirne E. Tomasa Rand, MD [facility]

## 2022-08-19 NOTE — Progress Notes (Signed)
Pt's states no medical or surgical changes since previsit or office visit. 

## 2022-08-19 NOTE — Progress Notes (Signed)
Sedate, gd SR, tolerated procedure well, VSS, report to RN 

## 2022-08-19 NOTE — Patient Instructions (Signed)
Resume all of your previous medications today.  Read all of the handouts given to you by your recovery room nurse.  YOU HAD AN ENDOSCOPIC PROCEDURE TODAY AT THE [facility]:   Refer to the procedure report that was given to you for any specific questions about what was found during the examination.  If the procedure report does not answer your questions, please call your gastroenterologist to clarify.  If you requested that your care partner not be given the details of your procedure findings, then the procedure report has been included in a sealed envelope for you to review at your convenience later.  YOU SHOULD EXPECT: Some feelings of bloating in the abdomen. Passage of more gas than usual.  Walking can help get rid of the air that was put into your GI tract during the procedure and reduce the bloating. If you had a lower endoscopy (such as a colonoscopy or flexible sigmoidoscopy) you may notice spotting of blood in your stool or on the toilet paper. If you underwent a bowel prep for your procedure, you may not have a normal bowel movement for a few days.  Please Note:  You might notice some irritation and congestion in your nose or some drainage.  This is from the oxygen used during your procedure.  There is no need for concern and it should clear up in a day or so.  SYMPTOMS TO REPORT IMMEDIATELY:  Following lower endoscopy (colonoscopy or flexible sigmoidoscopy):  Excessive amounts of blood in the stool  Significant tenderness or worsening of abdominal pains  Swelling of the abdomen that is new, acute  Fever of 100F or higher   For urgent or emergent issues, a gastroenterologist can be reached at any hour by calling [phone]. Do not use MyChart messaging for urgent concerns.    DIET:  We do recommend a small meal at first, but then you may proceed to your regular diet.  Drink plenty of fluids but you should avoid alcoholic beverages for 24 hours.  ACTIVITY:  You  should plan to take it easy for the rest of today and you should NOT DRIVE or use heavy machinery until tomorrow (because of the sedation medicines used during the test).    FOLLOW UP: Our staff will call the number listed on your records the next business day following your procedure.  We will call around 7:15- 8:00 am to check on you and address any questions or concerns that you may have regarding the information given to you following your procedure. If we do not reach you, we will leave a message.     If any biopsies were taken you will be contacted by phone or by letter within the next 1-3 weeks.  Please call us at [phone] if you have not heard about the biopsies in 3 weeks.    SIGNATURES/CONFIDENTIALITY: You and/or your care partner have signed paperwork which will be entered into your electronic medical record.  These signatures attest to the fact that that the information above on your After Visit Summary has been reviewed and is understood.  Full responsibility of the confidentiality of this discharge information lies with you and/or your care-partner. 

## 2022-08-20 ENCOUNTER — Telehealth: Payer: Self-pay | Admitting: *Deleted

## 2022-08-20 NOTE — Telephone Encounter (Signed)
  Follow up Call-     08/19/2022    7:14 AM  Call back number  Post procedure Call Back phone  # [phone]  Permission to leave phone message Yes     Patient questions:  Do you have a fever, pain , or abdominal swelling? No. Pain Score  0 *  Have you tolerated food without any problems? Yes.    Have you been able to return to your normal activities? Yes.    Do you have any questions about your discharge instructions: Diet   No. Medications  No. Follow up visit  No.  Do you have questions or concerns about your Care? No.  Actions: * If pain score is 4 or above: No action needed, pain <4.

## 2022-08-24 NOTE — Progress Notes (Signed)
Ms. Sinquefield,  The polyp which I removed during your recent procedure was proven to be completely benign but is considered a "pre-cancerous" polyp that MAY have grown into cancer if it had not been removed.  Studies shows that at least 20% of women over age 52 and 30% of men over age 28 have pre-cancerous polyps.  Based on current nationally recognized surveillance guidelines, I recommend that you have a repeat colonoscopy in 7 years.   If you develop any new rectal bleeding, abdominal pain or significant bowel habit changes, please contact me before then.

## 2022-08-25 ENCOUNTER — Ambulatory Visit (INDEPENDENT_AMBULATORY_CARE_PROVIDER_SITE_OTHER): Payer: BC Managed Care – PPO | Admitting: Physical Therapy

## 2022-08-25 ENCOUNTER — Encounter: Payer: Self-pay | Admitting: Physical Therapy

## 2022-08-25 DIAGNOSIS — M25562 Pain in left knee: Secondary | ICD-10-CM

## 2022-08-25 NOTE — Therapy (Unsigned)
OUTPATIENT PHYSICAL THERAPY LOWER EXTREMITY TREATMENT   Patient Name: Lori Harmon MRN: [id number] DOB:1970-02-18, 52 y.o., female Today's Date: 08/25/2022  END OF SESSION:  PT End of Session - 08/25/22 1258     Visit Number 6    Number of Visits 16    Date for PT Re-Evaluation 09/07/22    Authorization Type [employer]    PT Start Time 1259    PT Stop Time 1340    PT Time Calculation (min) 41 min    Activity Tolerance Patient tolerated treatment well    Behavior During Therapy [facility] for tasks assessed/performed               Past Medical History:  Diagnosis Date   ALLERGIC RHINITIS 01/27/2007   ANXIETY 01/27/2007   Arthritis    Mild L hip   DEPRESSION 01/27/2007   DIABETES MELLITUS, TYPE II 01/27/2007   HYPERLIPIDEMIA 01/27/2007   HYPERTENSION 01/27/2007   Palpitations    PCOS (polycystic ovarian syndrome)    SINUSITIS- ACUTE-NOS 01/30/2008   TURNER'S SYNDROME 01/30/2008   Past Surgical History:  Procedure Laterality Date   COLONOSCOPY     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   Pelvic mass 06/05/2021   Uterine leiomyoma 06/05/2021   Vitamin D deficiency 05/22/2020   Polymyalgia (HCC) 08/21/2018   Hypersomnolence 12/07/2016   Hematuria 08/13/2015   Encounter for well adult exam with abnormal findings 12/07/2010   BACK PAIN 04/30/2009   TURNER'S SYNDROME 01/30/2008   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 Has not returned to running yet.   Feeling OK, knee has been making some noise. I've been trying to get back on my stationary bike more so that I'm doing something and it has been making some unusual noises. Still working on straightening my knee with Chamaine Stankus, I do have one leg longer than the other , just a hair longer, but wondering if that is leading to some of my knee issues. Tried an insert one day but it really didn't work well, caused a lot of pain so I took it out. Pain is usually after I exercise, or after doing stairs at work.   Eval: Pt states L knee, for about 2 months, No incident to report. Has not had previous pain in knees. Stairs and increased walking , bending have been painful. Had has had to cut back on exercise, due to pain. Previously, she was jogging 40 min,  5 days w/k , and 15 min of pilates/day .Has had trouble in the past sustaining exercise due to increased pain in other areas. States some previous ankle rolls, weakness.  Sneakers: asics  or nike,    PERTINENT HISTORY: None   PAIN:  Are you having pain? 0/10 today  PRECAUTIONS: None  WEIGHT BEARING RESTRICTIONS: No  FALLS:  Has patient fallen in last 6 months? No  PLOF: Independent  PATIENT GOALS:  Decreased pain   NEXT MD VISIT:   OBJECTIVE:   DIAGNOSTIC FINDINGS:    PATIENT SURVEYS:  FOTO:   69.4  COGNITION: Overall cognitive status: Within functional limits for tasks assessed     SENSATION: WFL  EDEMA: n/a   POSTURE:  Feet: normal/neutral alignment, back/hips: neutral. Standing: L knee in slight flexion  PALPATION: minimal pain to palpate today, mild tenderness at patella tendon. States normal pain in front of knee and around patella.    LOWER EXTREMITY ROM:  Hips: WFL Knees: WFL Ankles: mild limitation for DF on R.   LOWER EXTREMITY MMT:  Hip flex: 4/5,  abd: 4+/5 Knee flex: 4+5,  ext: 4/5 on L.   LOWER  EXTREMITY SPECIAL TESTS:    FUNCTIONAL TESTS:  Stairs: mild pain at anterior knee.   GAIT: mild flexion in L knee.    TODAY'S TREATMENT:                                                                                                                              DATE:   08/25/2022  Therapeutic Exercise: Aerobic:   Supine:  bridges  x 10, SL bridge x 10 bil;  Seated:  LAQ 4 lb x 15 bil;  Standing:  forward step ups 6 inch step x12 B, Squats 10lb x 15;  Walk/run on t-mill x 5 min for gait assessment and cuing for longer stride on L with running . Stretches: L knee joint mobs to inc extension Other :  Modalities : Ionto to L knee, patella tendon, 4 hr patch with dexamethasone, pt instructed in use, wear time and precautions.  Neuromuscular Re-education: Manual Therapy:    Previous  Therapeutic Exercise: Aerobic:  L2 x 8 min;  Supine:  review of quad sets on L x 10;  Seated:  LAQ 3 lb x 10 bil;   Sit to stand, mat table  x 10;    Standing:  Squats at mat table, 10 lb 2 x 10;  lateral squat walk Gtb 20 ft x 4;  Stretches:  Other :  Neuromuscular Re-education: Manual Therapy: TPR and IASTM to L quad;  Mobilization for increasing L knee extension.    Previous: Step ups 6 in x 10 bil no UE support (could back down to 4 in )  K-tape    PATIENT EDUCATION:  Education details: updated and reviewed HEP Person educated: Patient Education method: Explanation, Demonstration, Tactile cues, Verbal cues, and Handouts Education comprehension: verbalized understanding, returned demonstration, verbal cues required, tactile cues required, and needs further education   HOME EXERCISE PROGRAM: Access Code: [email or web address]  - Supine Quadricep Sets  - 1 x daily - 2 sets - 10 reps - Straight Leg Raise  - 1 x daily - 2 sets - 10 reps - Sidelying Hip Abduction  - 1 x daily - 2 sets - 10 reps - Supine Bridge  - 1 x daily -  2 sets - 10 reps - Seated Knee Extension AROM  - 1 x daily - 2 sets - 10  reps  ASSESSMENT:  CLINICAL IMPRESSION: Pt progressing well. She is showing improving strength and no pain with LAQ and squats. She does continue to have mild soreness with going down stairs, and mild with going up at times. Running assessment today on t-mill, pt with shorter stride length on L, with pain, but decreased pain after cuing for longer/full stride on L. Discussed starting to run light intervals to see how knee responds.   Eval: Patient presents with primary complaint of  increased pain in L knee. She has symptoms consistent with patella femoral syndrome. She has mild weakness in quad and hips, and lack of effective HEP for this diagnosis. Pt with decreased ability for full functional activities, stairs, exercise, and walk/run, and will benefit from skilled PT to improve.    OBJECTIVE IMPAIRMENTS: decreased activity tolerance, decreased mobility, decreased ROM, decreased strength, improper body mechanics, and pain.   ACTIVITY LIMITATIONS: bending, standing, squatting, stairs, and locomotion level  PARTICIPATION LIMITATIONS: cleaning, laundry, shopping, community activity, and yard work  PERSONAL FACTORS:  none  are also affecting patient's functional outcome.   REHAB POTENTIAL: Good  CLINICAL DECISION MAKING: Stable/uncomplicated  EVALUATION COMPLEXITY: Low   GOALS: Goals reviewed with patient? Yes  SHORT TERM GOALS: Target date: 07/27/2022   Pt to be independent with initial HEP  Goal status: INITIAL  2.  Pt to report decreased pain to 2/10 in L knee   Goal status: INITIAL   LONG TERM GOALS: Target date: 09/07/2022  Pt to be independent with final HEP   Goal status: INITIAL  2.  Pt to report decreased pain in L knee to 0-1/10 with activity   Goal status: INITIAL  3.  Pt to demo ability for stair navigation without pain in knees , to improve ability for entry to work.   Goal status: INITIAL  4.  Pt to report ability for jog/walk for at least 1 mile,  without pain in knee, to improve ability for exercise.   Goal status: INITIAL  5.  Pt to demo strength and stability of L hip and knee to be [facility]. , to improve pain and function.   Goal status: INITIAL   PLAN:  PT FREQUENCY: 1-2x/week  PT DURATION: 8 weeks  PLANNED INTERVENTIONS: Therapeutic exercises, Therapeutic activity, Neuromuscular re-education, Patient/Family education, Self Care, Joint mobilization, Joint manipulation, Stair training, Orthotic/Fit training, DME instructions, Aquatic Therapy, Dry Needling, Electrical stimulation, Cryotherapy, Moist heat, Taping, Ultrasound, Ionotophoresis 4mg /ml Dexamethasone, Manual therapy,  Vasopneumatic device, Traction, Spinal manipulation, Spinal mobilization,Balance training, Gait training,   PLAN FOR NEXT SESSION: * check hip ext on L, terminal stance, gait , possible Dry needling for quad/lateral quad  as needed. Needs more isolated glute and lower back strengthening, work on DL form    Sedalia Muta, PT, DTP 1:20 PM  08/25/22

## 2022-08-26 ENCOUNTER — Encounter: Payer: Self-pay | Admitting: Physical Therapy

## 2022-08-27 DIAGNOSIS — N926 Irregular menstruation, unspecified: Secondary | ICD-10-CM | POA: Diagnosis not present

## 2022-08-27 DIAGNOSIS — R9389 Abnormal findings on diagnostic imaging of other specified body structures: Secondary | ICD-10-CM | POA: Diagnosis not present

## 2022-08-30 ENCOUNTER — Encounter: Payer: Self-pay | Admitting: Physical Therapy

## 2022-08-30 ENCOUNTER — Ambulatory Visit: Payer: BC Managed Care – PPO | Admitting: Physical Therapy

## 2022-08-30 DIAGNOSIS — M25562 Pain in left knee: Secondary | ICD-10-CM | POA: Diagnosis not present

## 2022-08-30 NOTE — Therapy (Signed)
OUTPATIENT PHYSICAL THERAPY LOWER EXTREMITY TREATMENT   Patient Name: Lori Harmon MRN: [id number] DOB:04/24/1970, 52 y.o., female Today's Date: 08/30/2022  END OF SESSION:  PT End of Session - 08/30/22 1842     Visit Number 7    Number of Visits 16    Date for PT Re-Evaluation 09/07/22    Authorization Type BCBS    PT Start Time 1216    PT Stop Time 1259    PT Time Calculation (min) 43 min    Activity Tolerance Patient tolerated treatment well    Behavior During Therapy [facility] for tasks assessed/performed               Past Medical History:  Diagnosis Date   ALLERGIC RHINITIS 01/27/2007   ANXIETY 01/27/2007   Arthritis    Mild L hip   DEPRESSION 01/27/2007   DIABETES MELLITUS, TYPE II 01/27/2007   HYPERLIPIDEMIA 01/27/2007   HYPERTENSION 01/27/2007   Palpitations    PCOS (polycystic ovarian syndrome)    SINUSITIS- ACUTE-NOS 01/30/2008   TURNER'S SYNDROME 01/30/2008   Past Surgical History:  Procedure Laterality Date   COLONOSCOPY     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   Pelvic mass 06/05/2021   Uterine leiomyoma 06/05/2021   Vitamin D deficiency 05/22/2020   Polymyalgia (HCC) 08/21/2018   Hypersomnolence 12/07/2016   Hematuria 08/13/2015   Encounter for well adult exam with abnormal findings 12/07/2010   BACK PAIN 04/30/2009   TURNER'S SYNDROME 01/30/2008   Turner's syndrome 01/30/2008   Impaired glucose tolerance 01/27/2007   HLD (hyperlipidemia) 01/27/2007   Anxiety state 01/27/2007   Depression 01/27/2007   Essential hypertension 01/27/2007   ALLERGIC RHINITIS 01/27/2007     PCP: Oliver Barre  REFERRING PROVIDER: Clementeen Graham   REFERRING DIAG: L knee pain   THERAPY DIAG:  Acute pain of left knee  Rationale for Evaluation and Treatment: Rehabilitation  ONSET DATE:    SUBJECTIVE:    SUBJECTIVE STATEMENT: Pt reports doing a bit better. She was able to go for a run, 10 min, without knee pain. Also has been concentrating on mechanics with stairs, and thinks it is helping.   Feeling OK, knee has been making some noise. I've been trying to get back on my stationary bike more so that I'm doing something and it has been making some unusual noises. Still working on straightening my knee with [employer], I do have one leg longer than the other , just a hair longer, but wondering if that is leading to some of my knee issues. Tried an insert one day but it really didn't work well, caused a lot of pain so I took it out. Pain is usually after I exercise, or after doing stairs at work.   Eval: Pt states L knee, for about 2 months, No incident to report. Has not had previous pain in knees. Stairs and increased walking , bending have been painful. Had has had to cut back on exercise, due to pain. Previously, she was jogging 40 min,  5 days w/k , and 15 min of pilates/day .Has had trouble in the past sustaining exercise due to increased pain in other areas. States some previous ankle rolls, weakness.  Sneakers: asics  or nike,    PERTINENT HISTORY: None   PAIN:  Are you having pain? 0/10 today  PRECAUTIONS: None  WEIGHT BEARING RESTRICTIONS: No  FALLS:  Has patient fallen in last 6 months? No   PLOF: Independent  PATIENT GOALS:  Decreased pain   NEXT MD VISIT:   OBJECTIVE:   DIAGNOSTIC FINDINGS:    PATIENT SURVEYS:  FOTO:   69.4   COGNITION: Overall cognitive status: Within functional limits for tasks assessed     SENSATION: WFL  EDEMA: n/a   POSTURE:  Feet: normal/neutral alignment, back/hips: neutral. Standing: L knee in slight flexion  PALPATION: minimal pain to palpate today, mild tenderness at patella tendon. States normal pain in front of knee and around patella.    LOWER EXTREMITY ROM:  Hips: WFL Knees: WFL Ankles: mild limitation for DF on R.    LOWER EXTREMITY MMT:  Hip flex: 4/5,  abd: 4+/5 Knee flex: 4+5,  ext: 4/5 on L.   LOWER EXTREMITY SPECIAL TESTS:    FUNCTIONAL TESTS:  Stairs: mild pain at anterior knee.   GAIT: mild flexion in L knee.    TODAY'S TREATMENT:                                                                                                                              DATE:   08/30/2022  Therapeutic Exercise: Aerobic:   Supine:   Seated:  LAQ 4 lb 2  x 10  bil;  Standing:  forward step ups 6 inch step x12 B, step downs 4 in x 10 bil; Squats 15lb x 20- cueing for mechanics in mirror ;  SLS with head turns x 10 bil;   SLS with runner lunge 3 x 5 bil;  Stretches: L knee joint mobs to inc extension Other :  Modalities :  Neuromuscular Re-education: Manual Therapy:    Previous  Therapeutic Exercise: Aerobic:  L2 x 8 min;  Supine:  review of quad sets on L x 10;  Seated:  LAQ 3 lb x 10 bil;   Sit to stand, mat table  x 10;    Standing:  Squats at mat table, 10 lb 2 x 10;  lateral squat walk Gtb 20 ft x 4;  Stretches:  Other :  Neuromuscular Re-education: Manual Therapy: TPR and IASTM to L quad;  Mobilization for increasing L knee extension.    Previous: Step ups 6 in x 10 bil no UE support (could back down to 4 in )  K-tape    PATIENT EDUCATION:  Education details: updated and reviewed HEP Person educated: Patient Education method: Explanation, Demonstration, Tactile cues, Verbal cues, and Handouts Education comprehension: verbalized understanding, returned demonstration, verbal cues required, tactile cues required, and needs further education   HOME EXERCISE PROGRAM: Access Code: [email or web address]  - Supine Quadricep Sets  - 1 x daily - 2 sets - 10 reps - Straight Leg Raise  - 1 x daily - 2 sets - 10 reps - Sidelying Hip Abduction  - 1 x daily - 2 sets - 10 reps -  Supine Bridge  - 1 x daily - 2 sets - 10 reps - Seated Knee Extension AROM  - 1 x daily - 2 sets - 10  reps  ASSESSMENT:  CLINICAL IMPRESSION: Pt progressing well with improving pain. She as able to run for the first time without pain. We discussed heel lift for R foot today, may issue to pt next visit if still doing well to try to even leg length in attempts for full knee extension on L. Pt to benefit from continued care.   Eval: Patient presents with primary complaint of  increased pain in L knee. She has symptoms consistent with patella femoral syndrome. She has mild weakness in quad and hips, and lack of effective HEP for this diagnosis. Pt with decreased ability for full functional activities, stairs, exercise, and walk/run, and will benefit from skilled PT to improve.    OBJECTIVE IMPAIRMENTS: decreased activity tolerance, decreased mobility, decreased ROM, decreased strength, improper body mechanics, and pain.   ACTIVITY LIMITATIONS: bending, standing, squatting, stairs, and locomotion level  PARTICIPATION LIMITATIONS: cleaning, laundry, shopping, community activity, and yard work  PERSONAL FACTORS:  none  are also affecting patient's functional outcome.   REHAB POTENTIAL: Good  CLINICAL DECISION MAKING: Stable/uncomplicated  EVALUATION COMPLEXITY: Low   GOALS: Goals reviewed with patient? Yes  SHORT TERM GOALS: Target date: 07/27/2022   Pt to be independent with initial HEP  Goal status: INITIAL  2.  Pt to report decreased pain to 2/10 in L knee   Goal status: INITIAL   LONG TERM GOALS: Target date: 09/07/2022  Pt to be independent with final HEP   Goal status: INITIAL  2.  Pt to report decreased pain in L knee to 0-1/10 with activity   Goal status: INITIAL  3.  Pt to demo ability for stair navigation without pain in knees , to improve ability for entry to work.   Goal status: INITIAL  4.  Pt to report ability for jog/walk for at least 1 mile, without pain in knee, to improve ability for exercise.   Goal status: INITIAL  5.  Pt to demo strength and  stability of L hip and knee to be [facility]. , to improve pain and function.   Goal status: INITIAL   PLAN:  PT FREQUENCY: 1-2x/week  PT DURATION: 8 weeks  PLANNED INTERVENTIONS: Therapeutic exercises, Therapeutic activity, Neuromuscular re-education, Patient/Family education, Self Care, Joint mobilization, Joint manipulation, Stair training, Orthotic/Fit training, DME instructions, Aquatic Therapy, Dry Needling, Electrical stimulation, Cryotherapy, Moist heat, Taping, Ultrasound, Ionotophoresis 4mg /ml Dexamethasone, Manual therapy,  Vasopneumatic device, Traction, Spinal manipulation, Spinal mobilization,Balance training, Gait training,   PLAN FOR NEXT SESSION: * check hip ext on L, terminal stance, gait , possible Dry needling for quad/lateral quad  as needed. Needs more isolated glute and lower back strengthening, work on DL form   .Sedalia Muta, PT, DPT 6:47 PM  08/30/22

## 2022-09-10 ENCOUNTER — Encounter: Payer: Self-pay | Admitting: Physical Therapy

## 2022-09-10 ENCOUNTER — Ambulatory Visit (INDEPENDENT_AMBULATORY_CARE_PROVIDER_SITE_OTHER): Payer: BC Managed Care – PPO | Admitting: Physical Therapy

## 2022-09-10 DIAGNOSIS — M25562 Pain in left knee: Secondary | ICD-10-CM | POA: Diagnosis not present

## 2022-09-10 NOTE — Therapy (Signed)
OUTPATIENT PHYSICAL THERAPY LOWER EXTREMITY TREATMENT   Patient Name: CANDINA SOTTO MRN: [id number] DOB:1970/07/09, 52 y.o., female Today's Date: 09/10/2022  END OF SESSION:  PT End of Session - 09/10/22 1149     Visit Number 8    Number of Visits 16    Date for PT Re-Evaluation 09/07/22    Authorization Type [employer]BS    PT Start Time 1150    PT Stop Time 1230    PT Time Calculation (min) 40 min    Activity Tolerance Patient tolerated treatment well    Behavior During Therapy [facility] for tasks assessed/performed               Past Medical History:  Diagnosis Date   ALLERGIC RHINITIS 01/27/2007   ANXIETY 01/27/2007   Arthritis    Mild L hip   DEPRESSION 01/27/2007   DIABETES MELLITUS, TYPE II 01/27/2007   HYPERLIPIDEMIA 01/27/2007   HYPERTENSION 01/27/2007   Palpitations    PCOS (polycystic ovarian syndrome)    SINUSITIS- ACUTE-NOS 01/30/2008   TURNER'S SYNDROME 01/30/2008   Past Surgical History:  Procedure Laterality Date   COLONOSCOPY     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   Pelvic mass 06/05/2021   Uterine leiomyoma 06/05/2021   Vitamin D deficiency 05/22/2020   Polymyalgia (HCC) 08/21/2018   Hypersomnolence 12/07/2016   Hematuria 08/13/2015   Encounter for well adult exam with abnormal findings 12/07/2010   BACK PAIN 04/30/2009   TURNER'S SYNDROME 01/30/2008   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 She as been able to run about every other day, for up to 15 min. Still "feels" her knee, but not too painful and also resolves in about 1 hr after run.   Feeling OK, knee has been making some noise. I've been trying to get back on my stationary bike more so that I'm doing something and it has been making some unusual noises. Still working on straightening my knee with [employer], I do have one leg longer than the other , just a hair longer, but wondering if that is leading to some of my knee issues. Tried an insert one day but it really didn't work well, caused a lot of pain so I took it out. Pain is usually after I exercise, or after doing stairs at work.   Eval: Pt states L knee, for about 2 months, No incident to report. Has not had previous pain in knees. Stairs and increased walking , bending have been painful. Had has had to cut back on exercise, due to pain. Previously, she was jogging 40 min,  5 days w/k , and 15 min of pilates/day .Has had trouble in the past sustaining exercise due to increased pain in other areas. States some previous ankle rolls, weakness.  Sneakers: asics  or nike,    PERTINENT HISTORY: None   PAIN:  Are you having  pain? 0/10 today  PRECAUTIONS: None  WEIGHT BEARING RESTRICTIONS: No  FALLS:  Has patient fallen in last 6 months? No   PLOF: Independent  PATIENT GOALS:  Decreased pain   NEXT MD VISIT:   OBJECTIVE: updated 09/10/22  DIAGNOSTIC FINDINGS:    PATIENT SURVEYS:  FOTO:   69.4   COGNITION: Overall cognitive status: Within functional limits for tasks assessed     SENSATION: WFL  EDEMA: n/a   POSTURE:  Feet: normal/neutral alignment, back/hips: neutral. Standing: L knee in slight flexion  PALPATION:   LOWER EXTREMITY ROM:  Hips: WFL Knees:  improved extension on L in supine with PROM/manual , near normal in supine  Ankles: mild limitation for DF on R.   LOWER EXTREMITY MMT:  Hip  flex: 4/5,  abd: 4+/5 Knee flex: 4+5,  ext: 4+ /5 on L.   LOWER EXTREMITY SPECIAL TESTS:    FUNCTIONAL TESTS:  Stairs: mild pain at anterior knee.   GAIT: mild flexion in L knee.    TODAY'S TREATMENT:                                                                                                                              DATE:   09/10/2022  Therapeutic Exercise: Aerobic:   Bike x 5 min, L 2 Supine:  SLR with TA x 10 bil;   S/L hip abd x 10 for review of HEP Quadruped: bird/dog 2 x 10  Standing: Squats 10 lb x 20;  SLS with runner lunge 3 x 5 bil;  Stretches:  Other :   heel lift added to R shoe, education on use, wear time and precautions.  Modalities :  Neuromuscular Re-education: Manual Therapy: L knee joint mobs to inc extension   Previous  Therapeutic Exercise: Aerobic:  L2 x 8 min;  Supine:  review of quad sets on L x 10;  Seated:  LAQ 3 lb x 10 bil;   Sit to stand, mat table  x 10;    Standing:  Squats at mat table, 10 lb 2 x 10;  lateral squat walk Gtb 20 ft x 4;  Stretches:  Other :  Neuromuscular Re-education: Manual Therapy: TPR and IASTM to L quad;  Mobilization for increasing L knee extension.    Previous: Step ups 6 in x 10 bil no UE support (could back down to 4 in )  K-tape    PATIENT EDUCATION:  Education details: updated and reviewed HEP Person educated: Patient Education method: Explanation, Demonstration, Tactile cues, Verbal cues, and Handouts Education comprehension: verbalized understanding, returned demonstration, verbal cues required, tactile cues required, and needs further education   HOME EXERCISE PROGRAM: Access Code: [facility]   ASSESSMENT:  CLINICAL IMPRESSION: Pt progressing well with ther ex, strength, and pain. She has improved extension ROM on table. Still with slight flexion in stance. Added Heel lift to R shoe today for trial of evening leg length. Discussed wear time and use, pt  will continue to wear for a couple  weeks, and will return for 1 final visit, to re-assess heel lift, knee pain, and will likely d/c to HEP at that time. She has improving strength of quad, and much improved ability for functional activities and squat motion. She continues to have mild soreness going down stairs. She has been able to increase her activity level as well as running without increased pain.   Eval: Patient presents with primary complaint of  increased pain in L knee. She has symptoms consistent with patella femoral syndrome. She has mild weakness in quad and hips, and lack of effective HEP for this diagnosis. Pt with decreased ability for full functional activities, stairs, exercise, and walk/run, and will benefit from skilled PT to improve.    OBJECTIVE IMPAIRMENTS: decreased activity tolerance, decreased mobility, decreased ROM, decreased strength, improper body mechanics, and pain.   ACTIVITY LIMITATIONS: bending, standing, squatting, stairs, and locomotion level  PARTICIPATION LIMITATIONS: cleaning, laundry, shopping, community activity, and yard work  PERSONAL FACTORS:  none  are also affecting patient's functional outcome.   REHAB POTENTIAL: Good  CLINICAL DECISION MAKING: Stable/uncomplicated  EVALUATION COMPLEXITY: Low   GOALS: Goals reviewed with patient? Yes  SHORT TERM GOALS: Target date: 07/27/2022   Pt to be independent with initial HEP  Goal status: MET  2.  Pt to report decreased pain to 2/10 in L knee   Goal status: MET   LONG TERM GOALS: Target date: 10/22/2022  Pt to be independent with final HEP   Goal status: MET  2.  Pt to report decreased pain in L knee to 0-1/10 with activity   Goal status: Partially met   3.  Pt to demo ability for stair navigation without pain in knees , to improve ability for entry to work.   Goal status: In progress   4.  Pt to report ability for jog/walk for at least 1 mile, without pain in knee, to improve ability for exercise.   Goal status: In  progress   5.  Pt to demo strength and stability of L hip and knee to be [facility]. , to improve pain and function.   Goal status: in progress    PLAN:  PT FREQUENCY: 1-2x/week  PT DURATION: 8 weeks  PLANNED INTERVENTIONS: Therapeutic exercises, Therapeutic activity, Neuromuscular re-education, Patient/Family education, Self Care, Joint mobilization, Joint manipulation, Stair training, Orthotic/Fit training, DME instructions, Aquatic Therapy, Dry Needling, Electrical stimulation, Cryotherapy, Moist heat, Taping, Ultrasound, Ionotophoresis 4mg /ml Dexamethasone, Manual therapy,  Vasopneumatic device, Traction, Spinal manipulation, Spinal mobilization,Balance training, Gait training,   PLAN FOR NEXT SESSION: * f/u for heel lift,  stairs, running, and foto. Likley d/c   .Sedalia Muta, PT, DPT 1:28 PM  09/10/22

## 2022-09-15 ENCOUNTER — Encounter: Payer: BC Managed Care – PPO | Admitting: Physical Therapy

## 2022-09-28 ENCOUNTER — Encounter: Payer: Self-pay | Admitting: Physical Therapy

## 2022-09-28 ENCOUNTER — Ambulatory Visit (INDEPENDENT_AMBULATORY_CARE_PROVIDER_SITE_OTHER): Payer: BC Managed Care – PPO | Admitting: Physical Therapy

## 2022-09-28 DIAGNOSIS — M25562 Pain in left knee: Secondary | ICD-10-CM

## 2022-09-28 NOTE — Therapy (Signed)
OUTPATIENT PHYSICAL THERAPY LOWER EXTREMITY TREATMENT - D/C   Patient Name: Lori Harmon MRN: [id number] DOB:[birth year], 52 y.o., female Today's Date: 09/28/2022  END OF SESSION:  PT End of Session - 09/28/22 1214     Visit Number 9    Number of Visits 16    Date for PT Re-Evaluation 10/22/22    Authorization Type [employer]    PT Start Time 1215    PT Stop Time 1300    PT Time Calculation (min) 45 min    Activity Tolerance Patient tolerated treatment well    Behavior During Therapy [facility] for tasks assessed/performed               Past Medical History:  Diagnosis Date   ALLERGIC RHINITIS 01/27/2007   ANXIETY 01/27/2007   Arthritis    Mild L hip   DEPRESSION 01/27/2007   DIABETES MELLITUS, TYPE II 01/27/2007   HYPERLIPIDEMIA 01/27/2007   HYPERTENSION 01/27/2007   Palpitations    PCOS (polycystic ovarian syndrome)    SINUSITIS- ACUTE-NOS 01/30/2008   TURNER'S SYNDROME 01/30/2008   Past Surgical History:  Procedure Laterality Date   COLONOSCOPY     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   Pelvic mass 06/05/2021   Uterine leiomyoma 06/05/2021   Vitamin D deficiency 05/22/2020   Polymyalgia (HCC) 08/21/2018   Hypersomnolence 12/07/2016   Hematuria 08/13/2015   Encounter for well adult exam with abnormal findings 12/07/2010   BACK PAIN 04/30/2009   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 Not having any pain in knee. Has been doing HEP, and has also been able to run 2x/wk, for up to 20 min. She is doing HEP. She is also wearing heel lift in R shoe, thinks its helpful to let knee straighten out more. Has not worn it to run yet.    Eval: Pt states L knee, for about 2 months, No incident to report. Has not had previous pain in knees. Stairs and increased walking , bending have been painful. Had has had to cut back on exercise, due to pain. Previously, she was jogging 40 min,  5 days w/k , and 15 min of pilates/day .Has had trouble in the past sustaining exercise due to increased pain in other areas. States some previous ankle rolls, weakness.  Sneakers: asics  or nike,    PERTINENT HISTORY: None   PAIN:  Are you having pain? 0/10 today  PRECAUTIONS: None  WEIGHT BEARING RESTRICTIONS: No  FALLS:  Has patient fallen in last 6 months? No   PLOF: Independent  PATIENT GOALS:  Decreased pain   NEXT MD VISIT:   OBJECTIVE: updated 09/28/22  DIAGNOSTIC FINDINGS:    PATIENT SURVEYS:  FOTO:   69..4 Visit 9: d/c:  85.9   COGNITION: Overall cognitive status: Within functional limits for tasks assessed     SENSATION:  WFL  EDEMA: n/a   POSTURE:  Feet: normal/neutral alignment, back/hips: neutral. Standing: L knee in slight flexion  PALPATION:   LOWER EXTREMITY ROM:  Hips: WFL Knees:  improved extension on L in supine with PROM/manual , near normal in supine  Ankles: mild limitation for DF on R.   LOWER EXTREMITY MMT:  Hip flex: 4+/5,  abd: 4+/5 Knee flex: 5/5  LOWER EXTREMITY SPECIAL TESTS:    FUNCTIONAL TESTS:      TODAY'S TREATMENT:                                                                                                                              DATE:  09/28/2022  Therapeutic Exercise: Aerobic:   Supine:  SLR with TA x 10 bil;   Quadruped: bird/dog 2 x 10  Standing: Squats 10  lb x 20;  SLS with runner lunge 3 x 5 bil;  Step ups with opp knee drive x 15 bil;  Up/down 5 steps no hands, x 5,  Stretches:  Other :  reviewed use of heel lift  in  R shoe, Modalities :  Neuromuscular Re-education: Manual Therapy:    Previous  Therapeutic Exercise: Aerobic:  L2 x 8 min;  Supine:  review of quad sets on L x 10;  Seated:  LAQ 3 lb x 10 bil;   Sit to stand, mat table  x 10;    Standing:  Squats at mat table, 10 lb 2 x 10;  lateral squat walk Gtb 20 ft x 4;  Stretches:  Other :  Neuromuscular Re-education: Manual Therapy: TPR and IASTM to L quad;  Mobilization for increasing L knee extension.    Previous: Step ups 6 in x 10 bil no UE support (could back down to 4 in )  K-tape    PATIENT EDUCATION:  Education details: updated and reviewed HEP Person educated: Patient Education method: Explanation, Demonstration, Tactile cues, Verbal cues, and Handouts Education comprehension: verbalized understanding, returned demonstration, verbal cues required, tactile cues required, and needs further education   HOME EXERCISE PROGRAM: Access Code: [facility]   ASSESSMENT:  CLINICAL IMPRESSION: Pt has made good progress. She has met goals at this time, and is ready for d/c to HEP. Final HEP reviewed today. She has much improved ability for quad contraction and strengthening for quad without pain in knee. She has been able to increase activity level without pain. She is ready for d/c at this time, pt in agreement with plan. Discussed continued use of heel lift if she feels helpful.   Eval: Patient presents with primary complaint of  increased pain in L knee. She has symptoms consistent with patella femoral syndrome. She has mild weakness in quad and hips, and lack of effective HEP for this diagnosis. Pt with decreased ability for full functional activities, stairs, exercise, and walk/run, and will benefit from skilled PT to improve.    OBJECTIVE IMPAIRMENTS: decreased  activity tolerance, decreased mobility,  decreased ROM, decreased strength, improper body mechanics, and pain.   ACTIVITY LIMITATIONS: bending, standing, squatting, stairs, and locomotion level  PARTICIPATION LIMITATIONS: cleaning, laundry, shopping, community activity, and yard work  PERSONAL FACTORS:  none  are also affecting patient's functional outcome.   REHAB POTENTIAL: Good  CLINICAL DECISION MAKING: Stable/uncomplicated  EVALUATION COMPLEXITY: Low   GOALS: Goals reviewed with patient? Yes  SHORT TERM GOALS: Target date: 07/27/2022   Pt to be independent with initial HEP  Goal status: MET  2.  Pt to report decreased pain to 2/10 in L knee   Goal status: MET   LONG TERM GOALS: Target date: 10/22/2022  Pt to be independent with final HEP   Goal status: MET  2.  Pt to report decreased pain in L knee to 0-1/10 with activity   Goal status: MET  3.  Pt to demo ability for stair navigation without pain in knees , to improve ability for entry to work.   Goal status: MET  4.  Pt to report ability for jog/walk for at least 1 mile, without pain in knee, to improve ability for exercise.   Goal status: MET  5.  Pt to demo strength and stability of L hip and knee to be [facility]. , to improve pain and function.   Goal status: MET   PLAN:  PT FREQUENCY: 1-2x/week  PT DURATION: 8 weeks  PLANNED INTERVENTIONS: Therapeutic exercises, Therapeutic activity, Neuromuscular re-education, Patient/Family education, Self Care, Joint mobilization, Joint manipulation, Stair training, Orthotic/Fit training, DME instructions, Aquatic Therapy, Dry Needling, Electrical stimulation, Cryotherapy, Moist heat, Taping, Ultrasound, Ionotophoresis 4mg /ml Dexamethasone, Manual therapy,  Vasopneumatic device, Traction, Spinal manipulation, Spinal mobilization,Balance training, Gait training,   PLAN FOR NEXT SESSION:  .Sedalia Muta, PT, DPT 12:14 PM  09/28/22     PHYSICAL THERAPY  DISCHARGE SUMMARY  Visits from Start of Care: 9  Plan: Patient agrees to discharge.  Patient goals were met. Patient is being discharged due to meeting the stated rehab goals.       Sedalia Muta, PT, DPT 12:56 PM  09/28/22

## 2023-01-24 IMAGING — MG MM DIGITAL DIAGNOSTIC UNILAT*L* W/ TOMO W/ CAD
4 series · 4 of 12 positions shown · non-contrast
Comparison: Previous exams including recent screening mammogram
dated 07/14/2020.

CLINICAL DATA: Patient returns today to evaluate a possible LEFT
breast asymmetry questioned on recent screening mammogram.

EXAM:
DIGITAL DIAGNOSTIC UNILATERAL LEFT MAMMOGRAM WITH TOMOSYNTHESIS AND
CAD
TECHNIQUE: Left digital diagnostic mammography and breast tomosynthesis was
performed. The images were evaluated with computer-aided detection.

[L MLO synth-2D]
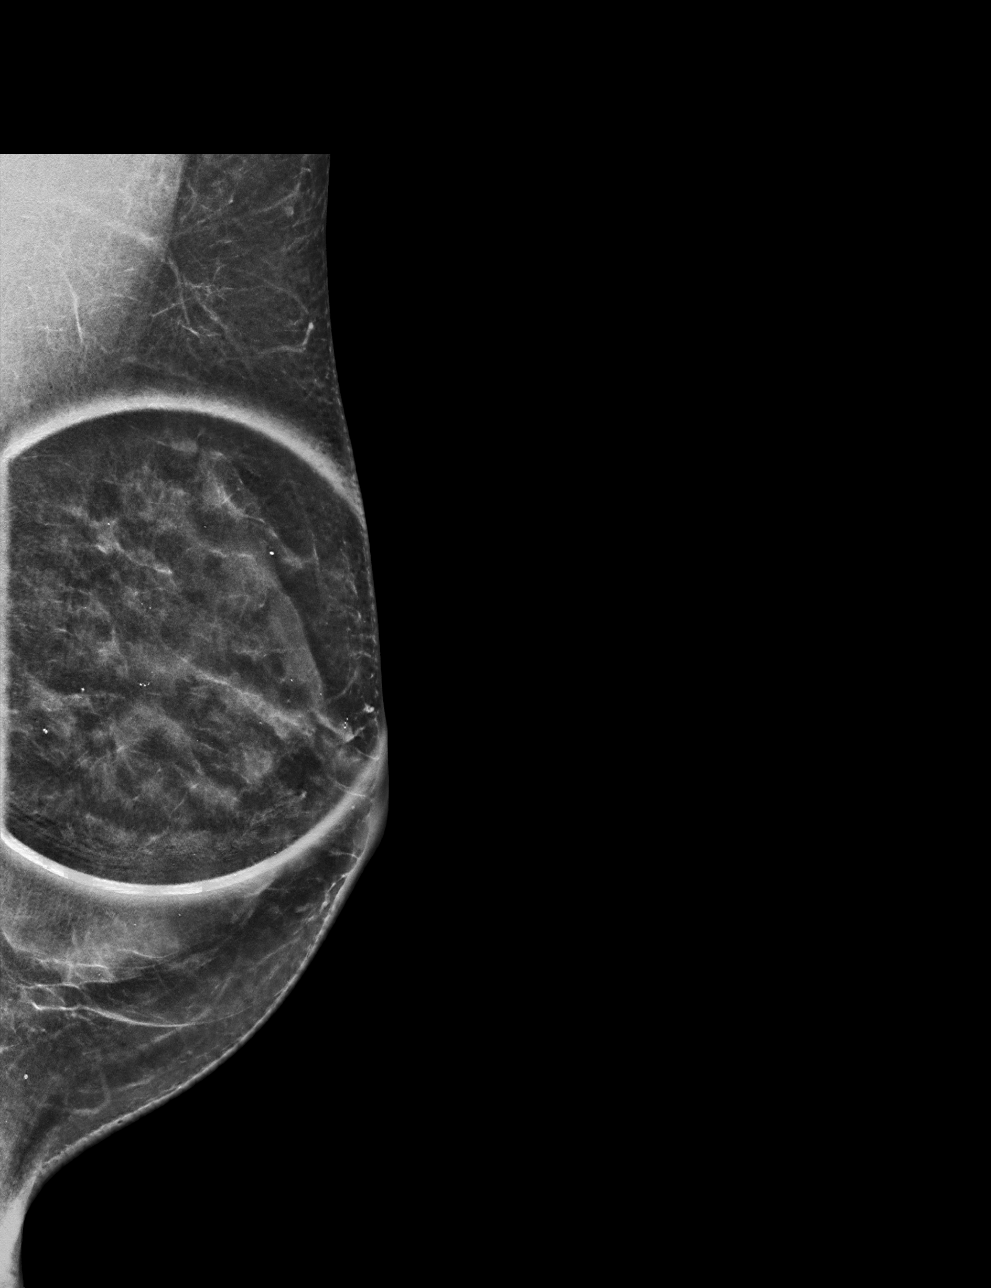

[L ML synth-2D]
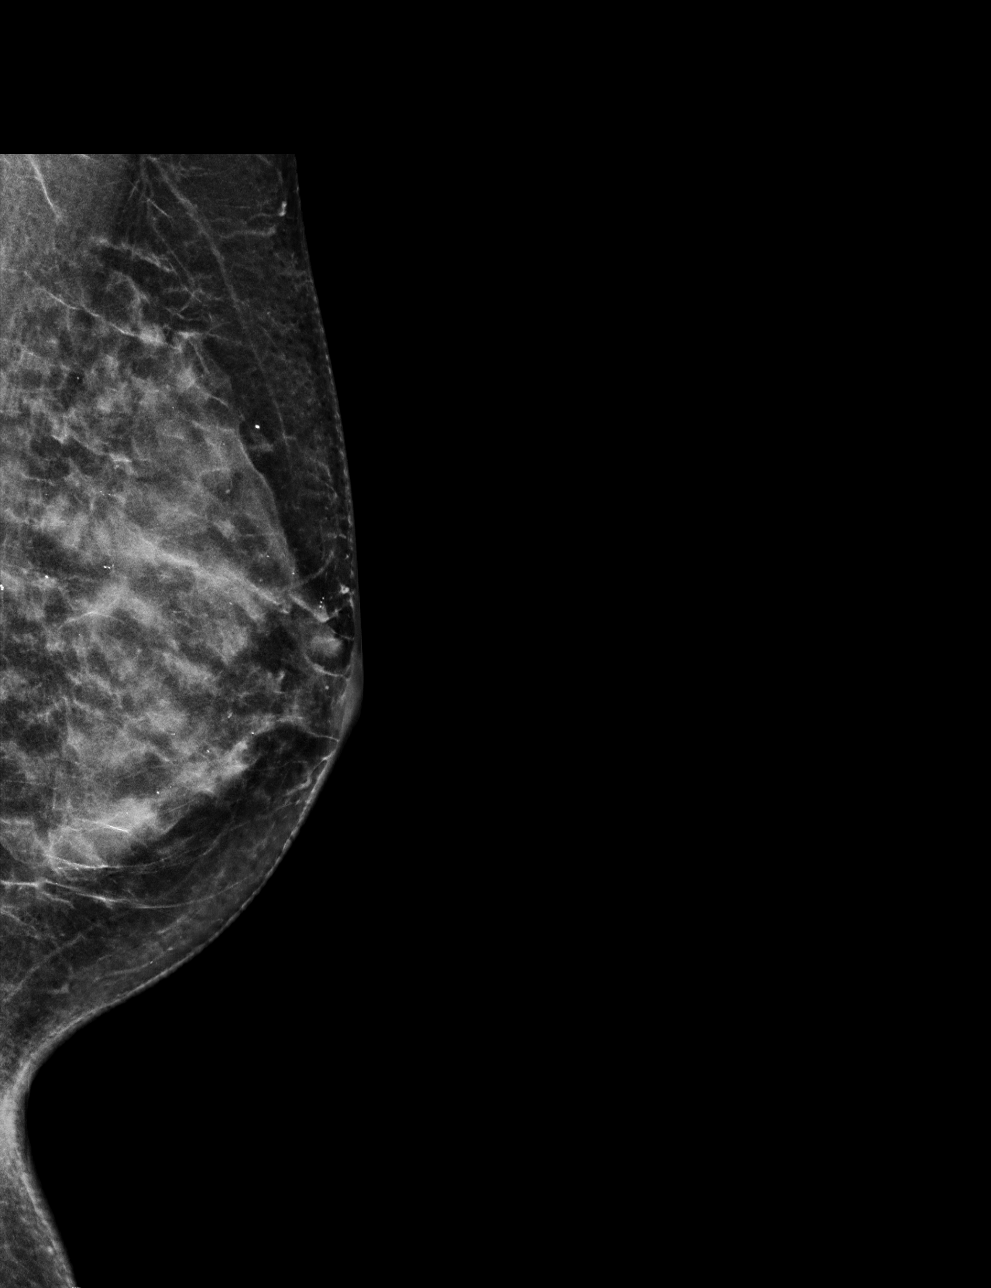

[L ML tomo · tomo slice 29/57.0]
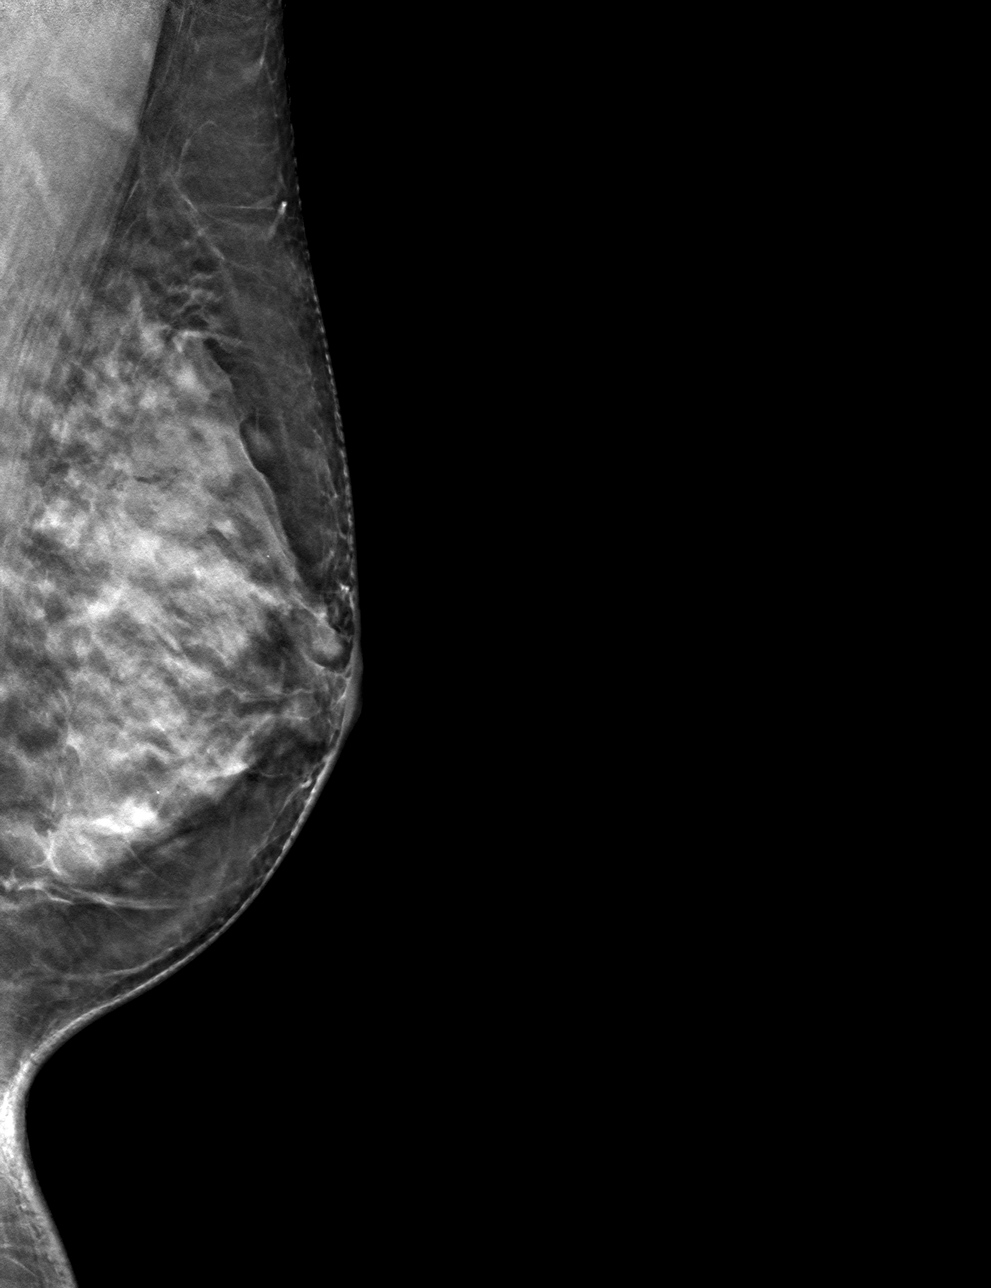

[L MLO tomo · tomo slice 31/60.0]
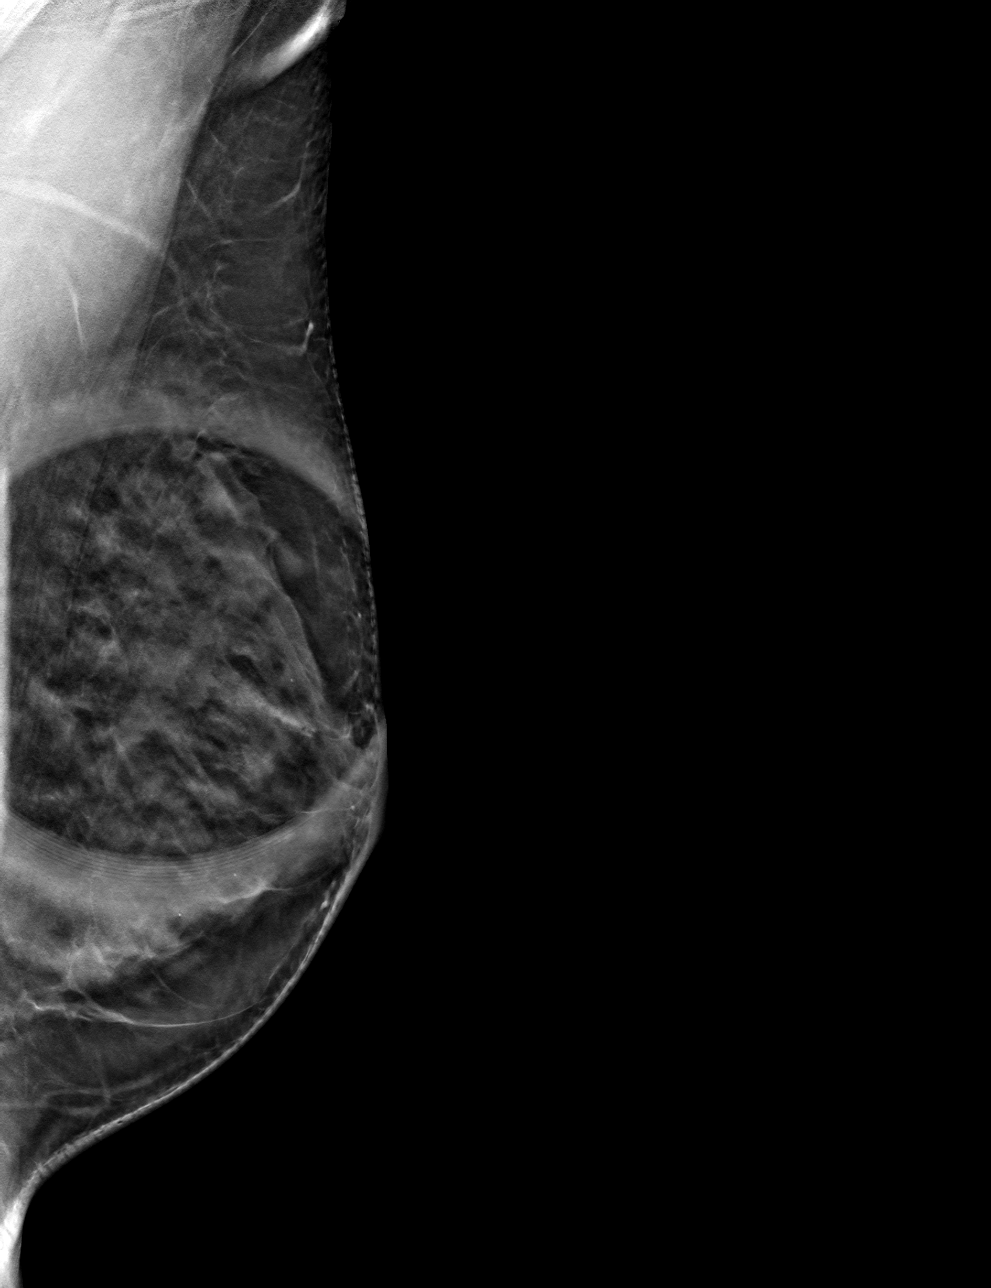

[4 of 12 positions shown; findings below may reference images not displayed]

ACR Breast Density Category d: The breast tissue is extremely dense,
which lowers the sensitivity of mammography.
FINDINGS: On today's additional diagnostic views, including spot compression
with 3D tomosynthesis, there is no persistent asymmetry within the
upper LEFT breast at posterior depth indicating superimposition of
normal dense fibroglandular tissues.
IMPRESSION: No evidence of malignancy.

Patient may return to routine annual bilateral screening mammogram
schedule.

RECOMMENDATION:
Screening mammogram in one year.(Code:[id number])

I have discussed the findings and recommendations with the patient.
If applicable, a reminder letter will be sent to the patient
regarding the next appointment.

BI-RADS CATEGORY  1: Negative.

## 2023-06-22 ENCOUNTER — Other Ambulatory Visit: Payer: Self-pay | Admitting: Internal Medicine

## 2023-07-25 ENCOUNTER — Other Ambulatory Visit (INDEPENDENT_AMBULATORY_CARE_PROVIDER_SITE_OTHER)

## 2023-07-25 ENCOUNTER — Other Ambulatory Visit: Payer: Self-pay | Admitting: Medical Genetics

## 2023-07-25 DIAGNOSIS — E7800 Pure hypercholesterolemia, unspecified: Secondary | ICD-10-CM | POA: Diagnosis not present

## 2023-07-25 DIAGNOSIS — E559 Vitamin D deficiency, unspecified: Secondary | ICD-10-CM | POA: Diagnosis not present

## 2023-07-25 DIAGNOSIS — R7302 Impaired glucose tolerance (oral): Secondary | ICD-10-CM

## 2023-07-25 DIAGNOSIS — E538 Deficiency of other specified B group vitamins: Secondary | ICD-10-CM

## 2023-07-25 LAB — CBC WITH DIFFERENTIAL/PLATELET
Basophils Absolute: 0.1 K/uL (ref 0.0–0.1)
Basophils Relative: 1 % (ref 0.0–3.0)
Eosinophils Absolute: 0.2 K/uL (ref 0.0–0.7)
Eosinophils Relative: 3.3 % (ref 0.0–5.0)
HCT: 40.3 % (ref 36.0–46.0)
Hemoglobin: 13.6 g/dL (ref 12.0–15.0)
Lymphocytes Relative: 32 % (ref 12.0–46.0)
Lymphs Abs: 2.1 K/uL (ref 0.7–4.0)
MCHC: 33.7 g/dL (ref 30.0–36.0)
MCV: 93.7 fl (ref 78.0–100.0)
Monocytes Absolute: 0.7 K/uL (ref 0.1–1.0)
Monocytes Relative: 11.6 % (ref 3.0–12.0)
Neutro Abs: 3.4 K/uL (ref 1.4–7.7)
Neutrophils Relative %: 52.1 % (ref 43.0–77.0)
Platelets: 293 K/uL (ref 150.0–400.0)
RBC: 4.3 Mil/uL (ref 3.87–5.11)
RDW: 12.9 % (ref 11.5–15.5)
WBC: 6.5 K/uL (ref 4.0–10.5)

## 2023-07-25 LAB — LIPID PANEL
Cholesterol: 179 mg/dL (ref 0–200)
HDL: 48.3 mg/dL (ref >39.00)
LDL Cholesterol: 106 mg/dL — ABNORMAL HIGH (ref 0–99)
NonHDL: 130.41
Total CHOL/HDL Ratio: 4
Triglycerides: 120 mg/dL (ref 0.0–149.0)
VLDL: 24 mg/dL (ref 0.0–40.0)

## 2023-07-25 LAB — HEMOGLOBIN A1C: Hgb A1c MFr Bld: 5.9 % (ref 4.6–6.5)

## 2023-07-25 LAB — VITAMIN B12: Vitamin B-12: 624 pg/mL (ref 211–911)

## 2023-07-25 LAB — HEPATIC FUNCTION PANEL
ALT: 15 U/L (ref 0–35)
AST: 17 U/L (ref 0–37)
Albumin: 4.7 g/dL (ref 3.5–5.2)
Alkaline Phosphatase: 51 U/L (ref 39–117)
Bilirubin, Direct: 0.1 mg/dL (ref 0.0–0.3)
Total Bilirubin: 0.6 mg/dL (ref 0.2–1.2)
Total Protein: 7.5 g/dL (ref 6.0–8.3)

## 2023-07-25 LAB — BASIC METABOLIC PANEL WITH GFR
BUN: 12 mg/dL (ref 6–23)
CO2: 25 meq/L (ref 19–32)
Calcium: 9.7 mg/dL (ref 8.4–10.5)
Chloride: 103 meq/L (ref 96–112)
Creatinine, Ser: 0.69 mg/dL (ref 0.40–1.20)
GFR: 99.15 mL/min (ref >60.00)
Glucose, Bld: 107 mg/dL — ABNORMAL HIGH (ref 70–99)
Potassium: 3.5 meq/L (ref 3.5–5.1)
Sodium: 136 meq/L (ref 135–145)

## 2023-07-25 LAB — URINALYSIS, ROUTINE W REFLEX MICROSCOPIC
Bilirubin Urine: NEGATIVE
Hgb urine dipstick: NEGATIVE
Ketones, ur: NEGATIVE
Leukocytes,Ua: NEGATIVE
Nitrite: NEGATIVE
RBC / HPF: NONE SEEN (ref 0–?)
Specific Gravity, Urine: <=1.005 — AB (ref 1.000–1.030)
Total Protein, Urine: NEGATIVE
Urine Glucose: NEGATIVE
Urobilinogen, UA: 0.2 (ref 0.0–1.0)
WBC, UA: NONE SEEN (ref 0–?)
pH: 6.5 (ref 5.0–8.0)

## 2023-07-25 LAB — TSH: TSH: 3.61 u[IU]/mL (ref 0.35–5.50)

## 2023-07-25 LAB — VITAMIN D 25 HYDROXY (VIT D DEFICIENCY, FRACTURES): VITD: 38.91 ng/mL (ref 30.00–100.00)

## 2023-07-27 ENCOUNTER — Encounter: Payer: Self-pay | Admitting: Internal Medicine

## 2023-07-27 ENCOUNTER — Ambulatory Visit: Admitting: Internal Medicine

## 2023-07-27 VITALS — BP 122/78 | HR 99 | Temp 97.7°F | Ht 60.75 in | Wt 119.2 lb

## 2023-07-27 DIAGNOSIS — I10 Essential (primary) hypertension: Secondary | ICD-10-CM | POA: Diagnosis not present

## 2023-07-27 DIAGNOSIS — E7800 Pure hypercholesterolemia, unspecified: Secondary | ICD-10-CM | POA: Diagnosis not present

## 2023-07-27 DIAGNOSIS — Z0000 Encounter for general adult medical examination without abnormal findings: Secondary | ICD-10-CM

## 2023-07-27 DIAGNOSIS — Z0001 Encounter for general adult medical examination with abnormal findings: Secondary | ICD-10-CM

## 2023-07-27 DIAGNOSIS — R7302 Impaired glucose tolerance (oral): Secondary | ICD-10-CM

## 2023-07-27 DIAGNOSIS — Q969 Turner's syndrome, unspecified: Secondary | ICD-10-CM

## 2023-07-27 DIAGNOSIS — E559 Vitamin D deficiency, unspecified: Secondary | ICD-10-CM

## 2023-07-27 DIAGNOSIS — E538 Deficiency of other specified B group vitamins: Secondary | ICD-10-CM

## 2023-07-27 MED ORDER — ESCITALOPRAM OXALATE 10 MG PO TABS: 10.0000 mg | ORAL_TABLET | Freq: Every day | ORAL | 3 refills | Status: AC

## 2023-07-27 MED ORDER — LOSARTAN POTASSIUM 50 MG PO TABS: 50.0000 mg | ORAL_TABLET | Freq: Every day | ORAL | 3 refills | Status: AC

## 2023-07-27 NOTE — Assessment & Plan Note (Signed)
 Lab Results  Component Value Date   HGBA1C 5.9 07/25/2023   Stable, pt to continue current medical treatment  - diet, wt control

## 2023-07-27 NOTE — Assessment & Plan Note (Signed)
 Age and sex appropriate education and counseling updated with regular exercise and diet Referrals for preventative services - none needed Immunizations addressed - for shingrx at pharmacy, declines hep b Smoking counseling  - none needed Evidence for depression or other mood disorder - none significant Most recent labs reviewed. I have personally reviewed and have noted: 1) the patient's medical and social history 2) The patient's current medications and supplements 3) The patient's height, weight, and BMI have been recorded in the chart

## 2023-07-27 NOTE — Progress Notes (Signed)
 Patient ID: Lori Harmon, female   DOB: December 06, 1970, 53 y.o.   MRN: [id number]         Chief Complaint:: wellness exam and los vit d, hld, htn, hyperglycemia, low vit d       HPI:  Lori Harmon is a 53 y.o. female here for wellness exam; for shingrx at pharmacy, declines hep b, plans to see GYN yearly. O/w up to date                        Also Pt denies chest pain, increased sob or doe, wheezing, orthopnea, PND, increased LE swelling, palpitations, dizziness or syncope.   Pt denies polydipsia, polyuria, or new focal neuro s/s.    Pt denies fever, wt loss, night sweats, loss of appetite, or other constitutional symptoms     Wt Readings from Last 3 Encounters:  07/27/23 119 lb 4 oz (54.1 kg)  08/19/22 118 lb (53.5 kg)  08/04/22 118 lb (53.5 kg)   BP Readings from Last 3 Encounters:  07/27/23 122/78  08/19/22 107/76  06/29/22 138/82   Immunization History  Administered Date(s) Administered   Influenza Whole 10/05/2007   Influenza,inj,Quad PF,6+ Mos 10/09/2014   Influenza-Unspecified 10/18/2016, 10/14/2017, 11/06/2021   PFIZER(Purple Top)SARS-COV-2 Vaccination 03/10/2019, 03/31/2019, 11/28/2019   Td 02/28/2008   Tdap 05/16/2020   Health Maintenance Due  Topic Date Due   Hepatitis B Vaccines (1 of 3 - 19+ 3-dose series) Never done   Zoster Vaccines- Shingrix (1 of 2) Never done   Cervical Cancer Screening (HPV/Pap Cotest)  08/06/2012      Past Medical History:  Diagnosis Date   ALLERGIC RHINITIS 01/27/2007   ANXIETY 01/27/2007   Arthritis    Mild L hip   DEPRESSION 01/27/2007   DIABETES MELLITUS, TYPE II 01/27/2007   HYPERLIPIDEMIA 01/27/2007   HYPERTENSION 01/27/2007   Palpitations    PCOS (polycystic ovarian syndrome)    SINUSITIS- ACUTE-NOS 01/30/2008   TURNER'S SYNDROME 01/30/2008   Past Surgical History:  Procedure Laterality Date   COLONOSCOPY      reports that she has never smoked. She has never used smokeless tobacco. She reports current alcohol use.  She reports that she does not use drugs. family history includes Alcohol abuse in some other family members; Cancer in her father and other family members; Diabetes in her mother and another family member; Heart attack in her father and another family member; Hypertension in an other family member; Stroke in an other family member. Allergies  Allergen Reactions   Flavoring Agent (Non-Screening) Diarrhea    STEVIA   Klonopin  [Clonazepam ]     fatigue   Latex Other (See Comments)   Current Outpatient Medications on File Prior to Visit  Medication Sig Dispense Refill   Cholecalciferol (VITAMIN D3) 50 MCG (2000 UT) capsule Take 2,000 Units by mouth daily.     ipratropium (ATROVENT ) 0.06 % nasal spray Place 2 sprays into both nostrils 4 (four) times daily. 15 mL 0   Multiple Vitamin (MULTIVITAMIN WITH MINERALS) TABS tablet Take 1 tablet by mouth daily.     Norgestim-Eth Estrad Triphasic (ORTHO TRI-CYCLEN, 28, PO) Take 1 tablet by mouth daily.     Omega-3 Fatty Acids (FISH OIL) 300 MG CAPS Take by mouth.     OVER THE COUNTER MEDICATION Allergy medication BID     No current facility-administered medications on file prior to visit.        ROS:  All others reviewed and  negative.  Objective        PE:  BP 122/78   Pulse 99   Temp 97.7 F (36.5 C) (Temporal)   Ht 5' 0.75 (1.543 m)   Wt 119 lb 4 oz (54.1 kg)   SpO2 99%   BMI 22.72 kg/m                 Constitutional: Pt appears in NAD               HENT: Head: NCAT.                Right Ear: External ear normal.                 Left Ear: External ear normal.                Eyes: . Pupils are equal, round, and reactive to light. Conjunctivae and EOM are normal               Nose: without d/c or deformity               Neck: Neck supple. Gross normal ROM               Cardiovascular: Normal rate and regular rhythm.                 Pulmonary/Chest: Effort normal and breath sounds without rales or wheezing.                Abd:  Soft, NT,  ND, + BS, no organomegaly               Neurological: Pt is alert. At baseline orientation, motor grossly intact               Skin: Skin is warm. No rashes, no other new lesions, LE edema - none               Psychiatric: Pt behavior is normal without agitation   Micro: none  Cardiac tracings I have personally interpreted today:  none  Pertinent Radiological findings (summarize): none   Lab Results  Component Value Date   WBC 6.5 07/25/2023   HGB 13.6 07/25/2023   HCT 40.3 07/25/2023   PLT 293.0 07/25/2023   GLUCOSE 107 (H) 07/25/2023   CHOL 179 07/25/2023   TRIG 120.0 07/25/2023   HDL 48.30 07/25/2023   LDLDIRECT 106.0 02/09/2017   LDLCALC 106 (H) 07/25/2023   ALT 15 07/25/2023   AST 17 07/25/2023   NA 136 07/25/2023   K 3.5 07/25/2023   CL 103 07/25/2023   CREATININE 0.69 07/25/2023   BUN 12 07/25/2023   CO2 25 07/25/2023   TSH 3.61 07/25/2023   HGBA1C 5.9 07/25/2023   MICROALBUR 2.0 (H) 05/09/2009   Assessment/Plan:  Lori Harmon is a 53 y.o. White or Caucasian [1] female with  has a past medical history of ALLERGIC RHINITIS (01/27/2007), ANXIETY (01/27/2007), Arthritis, DEPRESSION (01/27/2007), DIABETES MELLITUS, TYPE II (01/27/2007), HYPERLIPIDEMIA (01/27/2007), HYPERTENSION (01/27/2007), Palpitations, PCOS (polycystic ovarian syndrome), SINUSITIS- ACUTE-NOS (01/30/2008), and TURNER'S SYNDROME (01/30/2008).  Encounter for well adult exam with abnormal findings Age and sex appropriate education and counseling updated with regular exercise and diet Referrals for preventative services - none needed Immunizations addressed - for shingrx at pharmacy, declines hep b Smoking counseling  - none needed Evidence for depression or other mood disorder - none significant Most recent labs reviewed. I have personally reviewed and have noted: 1) the  patient's medical and social history 2) The patient's current medications and supplements 3) The patient's height, weight, and  BMI have been recorded in the chart   Essential hypertension BP Readings from Last 3 Encounters:  07/27/23 122/78  08/19/22 107/76  06/29/22 138/82   Stable, pt to continue medical treatment losartan  50 mg qd   HLD (hyperlipidemia) Lab Results  Component Value Date   LDLCALC 106 (H) 07/25/2023   Mild uncontrolled, declines statin,  pt to continue with lower chol diet   Impaired glucose tolerance Lab Results  Component Value Date   HGBA1C 5.9 07/25/2023   Stable, pt to continue current medical treatment  - diet, wt control   Vitamin D  deficiency Last vitamin D  Lab Results  Component Value Date   VD25OH 38.91 07/25/2023   Low, to start oral replacement  Followup: Return in about 1 year (around 07/26/2024).  Lynwood Rush, MD 07/27/2023 1:02 PM [facility]

## 2023-07-27 NOTE — Assessment & Plan Note (Signed)
 Last vitamin D  Lab Results  Component Value Date   VD25OH 38.91 07/25/2023   Low, to start oral replacement

## 2023-07-27 NOTE — Assessment & Plan Note (Signed)
 BP Readings from Last 3 Encounters:  07/27/23 122/78  08/19/22 107/76  06/29/22 138/82   Stable, pt to continue medical treatment losartan  50 mg qd

## 2023-07-27 NOTE — Assessment & Plan Note (Signed)
 Lab Results  Component Value Date   LDLCALC 106 (H) 07/25/2023   Mild uncontrolled, declines statin,  pt to continue with lower chol diet

## 2023-07-27 NOTE — Patient Instructions (Signed)
Please continue all other medications as before, and refills have been done if requested.  Please have the pharmacy call with any other refills you may need.  Please continue your efforts at being more active, low cholesterol diet, and weight control.  You are otherwise up to date with prevention measures today.  Please keep your appointments with your specialists as you may have planned  Please make an Appointment to return for your 1 year visit, or sooner if needed, with Lab testing by Appointment as well, to be done about 3-5 days before at the FIRST FLOOR Lab (so this is for TWO appointments - please see the scheduling desk as you leave)    

## 2023-08-19 ENCOUNTER — Other Ambulatory Visit (HOSPITAL_COMMUNITY)

## 2023-09-02 ENCOUNTER — Ambulatory Visit: Payer: Self-pay | Admitting: Internal Medicine

## 2023-09-02 ENCOUNTER — Ambulatory Visit (HOSPITAL_COMMUNITY)
Admission: RE | Admit: 2023-09-02 | Discharge: 2023-09-02 | Disposition: A | Discharge status: Home / Self Care | Source: Ambulatory Visit | Attending: Internal Medicine | Admitting: Internal Medicine

## 2023-09-02 DIAGNOSIS — I10 Essential (primary) hypertension: Secondary | ICD-10-CM | POA: Insufficient documentation

## 2023-09-02 DIAGNOSIS — Q969 Turner's syndrome, unspecified: Secondary | ICD-10-CM

## 2023-09-02 DIAGNOSIS — R9431 Abnormal electrocardiogram [ECG] [EKG]: Secondary | ICD-10-CM

## 2023-09-02 LAB — ECHOCARDIOGRAM COMPLETE
AR max vel: 2.1 cm2
AV Area VTI: 1.9 cm2
AV Area mean vel: 1.98 cm2
AV Mean grad: 5 mmHg
AV Peak grad: 8.7 mmHg
Ao pk vel: 1.48 m/s
Area-P 1/2: 4.39 cm2
S' Lateral: 1.9 cm

## 2023-10-28 ENCOUNTER — Other Ambulatory Visit: Payer: Self-pay | Admitting: Medical Genetics

## 2023-10-28 DIAGNOSIS — Z006 Encounter for examination for normal comparison and control in clinical research program: Secondary | ICD-10-CM

## 2023-11-28 LAB — GENECONNECT MOLECULAR SCREEN: Genetic Analysis Overall Interpretation: NEGATIVE

## 2023-12-12 ENCOUNTER — Encounter: Payer: Self-pay | Admitting: Internal Medicine
